# Patient Record
Sex: Female | Born: 1995 | Race: White | Hispanic: No | Marital: Married | Smoking: Never smoker
Health system: Southern US, Community
[De-identification: ages and names within clinical notes are randomized; demographics above are authoritative.]

## PROBLEM LIST (undated history)

## (undated) DIAGNOSIS — R091 Pleurisy: Secondary | ICD-10-CM

## (undated) DIAGNOSIS — Z8679 Personal history of other diseases of the circulatory system: Secondary | ICD-10-CM

## (undated) HISTORY — DX: Pleurisy: R09.1

## (undated) HISTORY — DX: Personal history of other diseases of the circulatory system: Z86.79

---
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
## 2016-10-11 DIAGNOSIS — Z8679 Personal history of other diseases of the circulatory system: Secondary | ICD-10-CM

## 2016-10-11 HISTORY — DX: Personal history of other diseases of the circulatory system: Z86.79

## 2017-01-13 ENCOUNTER — Encounter: Payer: Self-pay | Admitting: Cardiology

## 2017-01-13 ENCOUNTER — Ambulatory Visit (INDEPENDENT_AMBULATORY_CARE_PROVIDER_SITE_OTHER): Payer: BLUE CROSS/BLUE SHIELD | Admitting: Cardiology

## 2017-01-13 VITALS — BP 109/76 | HR 86 | Ht 69.0 in | Wt 136.8 lb

## 2017-01-13 DIAGNOSIS — R9431 Abnormal electrocardiogram [ECG] [EKG]: Secondary | ICD-10-CM | POA: Diagnosis not present

## 2017-01-13 DIAGNOSIS — I319 Disease of pericardium, unspecified: Secondary | ICD-10-CM

## 2017-01-13 DIAGNOSIS — B3323 Viral pericarditis: Secondary | ICD-10-CM | POA: Insufficient documentation

## 2017-01-13 NOTE — Patient Instructions (Signed)
Medication Instructions:  Take motrin/ibuprofen on a taper. The dosing schedule is as follows:  Take  THREE TIMES A DAY for TWO (2) DAYS Take  THREE TIMES A DAY for FOUR (4) DAYS Take  THREE TIMES A DAY for FOUR (4) DAYS Take  THREE TIMES A DAY for FOUR (4) DAYS  Please make sure you have some food on your stomach when you take this medication. Also drink 2 glasses of water each time you take the medication.  Labwork: none   Testing/Procedures:  Your physician has requested that you have an echocardiogram. Echocardiography is a painless test that uses sound waves to create images of your heart. It provides your doctor with information about the size and shape of your heart and how well your heart's chambers and valves are working. This procedure takes approximately one hour. There are no restrictions for this procedure.   Follow-Up: 3-4 weeks with Dr. Herbie Baltimore   If you need a refill on your cardiac medications before your next appointment, please call your pharmacy.

## 2017-01-13 NOTE — Progress Notes (Signed)
PCP: Pcp Not In System  Clinic Note: Chief Complaint  Patient presents with  . New Evaluation    pt c/o chest pain/pressure, stabbing and dizziness   . Chest Pain    Previously diagnosed with pericarditis    HPI: Rachel Carney is a 21 y.o. female with a PMH below who presents today for Evaluation of recurrent chest pain with prior diagnosis of pericarditis.  Recent Hospitalizations: [facility] March 31 20 --  presented with midsternal chest pain and shortness of breath for about a month. She had been diagnosed with pericarditis back in January 2018 but still noting continuous pressure-like sensation in chest. Pain is worse with deep inspiration. Apparently was tested for flu and strep back in January that was negative. She does have history of panic attacks. She was given Toradol made her feel much better.  Studies Reviewed:  CT angiogram chest regional on March 2018: No PE. Normal heart. No atherosclerotic disease noted. Unremarkable mediastinum. Clear lungs.  Interval History: Rachel Carney is a very pleasant young lady is presenting now for reevaluation for her sharp chest discomfort. Her initial evaluation was back in January up in [facility], [place] where she was noted to have some EKG abnormality is in the setting of sharp stabbing chest discomfort along with dyspnea. She was treated with a short course of steroids and ibuprofen.. Initially her symptoms resolved, but they have suddenly come back and are now persistent. She notes a constant pressure in her chest, it is not made worse or better by any activity. She states that there is no improvement or exacerbation by physicians. Nothing makes it worse as far as walking or lying down, sitting up. She does state it is exacerbated by taking a deep breath or coughing. She is now dealing with yet again another cold illness that she caught over spring break. Now with all this coughing the chest discomfort has significantly  worsened.  She denies any PND, orthopnea or edema.  No palpitations, lightheadedness, dizziness, weakness or syncope/near syncope. No TIA/amaurosis fugax symptoms.  No claudication.  ROS: A comprehensive was performed. Review of Systems  Constitutional: Positive for malaise/fatigue (Currently dealing with a viral illness). Negative for chills and fever.  HENT: Positive for congestion.   Respiratory: Positive for cough.   Cardiovascular: Positive for chest pain.  Neurological: Positive for dizziness (With standing up real fast).  Psychiatric/Behavioral: The patient is nervous/anxious (No recent panic attacks).   All other systems reviewed and are negative.   Past Medical History:  Diagnosis Date  . History of viral pericarditis 10/2016   Treated with short course steroids    History reviewed. No pertinent surgical history.  Current Meds  Medication Sig  . ibuprofen (ADVIL,MOTRIN) 800 MG tablet Take 800 mg by mouth every 8 (eight) hours as needed.  Marland Kitchen LORazepam (ATIVAN) 1 MG tablet Take 0.5 mg by mouth daily as needed.  . meloxicam (MOBIC) 7.5 MG tablet Take 7.5 mg by mouth daily.  . Multiple Vitamins-Minerals (MULTIVITAMIN WITH MINERALS) tablet Take 1 tablet by mouth daily.  . sertraline (ZOLOFT) 100 MG tablet Take 150 mg by mouth daily.    No Known Allergies  Social History   Social History  . Marital status: Single    Spouse name: N/A  . Number of children: N/A  . Years of education: N/A   Social History Main Topics  . Smoking status: Never Smoker  . Smokeless tobacco: Never Used  . Alcohol use None  . Drug use:  Unknown  . Sexual activity: Not Asked   Other Topics Concern  . None   Social History Narrative  . None    family history includes Anxiety disorder in her father; Atrial fibrillation in her maternal grandfather; Hypertension in her maternal grandfather; Lymphoma in her maternal grandmother.  Wt Readings from Last 3 Encounters:  01/13/17 136 lb  12.8 oz (62.1 kg) (64 %, Z= 0.37)*   * Growth percentiles are based on CDC 2-20 Years data.    PHYSICAL EXAM BP 109/76 (BP Location: Left Arm, Patient Position: Sitting, Cuff Size: Normal)   Pulse 86   Ht  (1.753 m)   Wt 136 lb 12.8 oz (62.1 kg)   BMI 20.20 kg/m  General appearance: alert, cooperative, appears stated age, no distress and Otherwise healthy-appearing. Well-nourished and well-groomed. She does seem a little bit ill, and his swelling somewhat from current illness. Neck: no adenopathy, no carotid bruit and no JVD Lungs: clear to auscultation bilaterally, normal percussion bilaterally and non-labored Heart: regular rate and rhythm, S1 and S2 normal, no murmur, click, rub or gallop; nondisplaced PMI Abdomen: soft, non-tender; bowel sounds normal; no masses,  no organomegaly; no HJR no HJRExtremities: extremities normal, atraumatic, no cyanosis,  Or edema  Pulses: 2+ and symmetric;  Skin: mobility and turgor normal, no edema, no evidence of bleeding or bruising and no lesions noted or  Neurologic: Mental status: Alert, oriented, thought content appropriate Cranial nerves: normal (II-XII grossly intact)    Adult ECG Report  Rate:  78 ;  Rhythm: normal sinus rhythm and Right axis deviation. Otherwise normal intervals and durations.;   Narrative Interpretation:  Relatively normal EKG.   Other studies Reviewed: Additional studies/ records that were reviewed today include:  Recent Labs:   NA    ASSESSMENT / PLAN: Problem List Items Addressed This Visit    Abnormal EKG    Right axis deviation noted on EKG. Need to evaluate for structural abnormality along with potential evidence of pericarditis.      Relevant Orders   EKG 12-Lead   ECHOCARDIOGRAM COMPLETE   Viral pericarditis - Primary    It sounds like her initial event was associated with a viral illness, and she currently is again having a viral illness. Her chest pain has recurred and I'm concerned for  persistent chronic pericarditis.   I do not feel that she was adequately treated initially. Plan: Check 2-D echocardiogram to exclude fibrinous pericarditis ; prolonged ibuprofen taper (see instructions below), if this does not work, would then use additional along with colchicine.       Relevant Orders   EKG 12-Lead   ECHOCARDIOGRAM COMPLETE      Current medicines are reviewed at length with the patient today. (+/- concerns) n/a The following changes have been made: n/a  Patient Instructions  Medication Instructions:  Take motrin/ibuprofen on a taper. The dosing schedule is as follows:  Take  THREE TIMES A DAY for TWO (2) DAYS Take  THREE TIMES A DAY for FOUR (4) DAYS Take  THREE TIMES A DAY for FOUR (4) DAYS Take  THREE TIMES A DAY for FOUR (4) DAYS  Please make sure you have some food on your stomach when you take this medication. Also drink 2 glasses of water each time you take the medication.  Labwork: none   Testing/Procedures:  Your physician has requested that you have an echocardiogram. Echocardiography is a painless test that uses sound waves to create images of your heart. It provides  your doctor with information about the size and shape of your heart and how well your heart's chambers and valves are working. This procedure takes approximately one hour. There are no restrictions for this procedure.   Follow-Up: 3-4 weeks with Dr. Herbie Baltimore   If you need a refill on your cardiac medications before your next appointment, please call your pharmacy.     Studies Ordered:   Orders Placed This Encounter  Procedures  . EKG 12-Lead  . ECHOCARDIOGRAM COMPLETE      Bryan Lemma, M.D., M.S. Interventional Cardiologist   Pager # [phone] Phone # [phone] [address] [place], [place] [address]

## 2017-01-17 ENCOUNTER — Encounter: Payer: Self-pay | Admitting: Cardiology

## 2017-01-17 DIAGNOSIS — R9431 Abnormal electrocardiogram [ECG] [EKG]: Secondary | ICD-10-CM | POA: Insufficient documentation

## 2017-01-17 NOTE — Assessment & Plan Note (Signed)
Right axis deviation noted on EKG. Need to evaluate for structural abnormality along with potential evidence of pericarditis.

## 2017-01-17 NOTE — Assessment & Plan Note (Addendum)
It sounds like her initial event was associated with a viral illness, and she currently is again having a viral illness. Her chest pain has recurred and I'm concerned for persistent chronic pericarditis.   I do not feel that she was adequately treated initially. Plan: Check 2-D echocardiogram to exclude fibrinous pericarditis ; prolonged ibuprofen taper (see instructions below), if this does not work, would then use additional along with colchicine.

## 2017-01-21 ENCOUNTER — Ambulatory Visit: Payer: Self-pay | Admitting: Cardiology

## 2017-01-28 ENCOUNTER — Ambulatory Visit (HOSPITAL_COMMUNITY): Payer: BLUE CROSS/BLUE SHIELD | Attending: Cardiology

## 2017-01-28 ENCOUNTER — Other Ambulatory Visit: Payer: Self-pay

## 2017-01-28 DIAGNOSIS — I301 Infective pericarditis: Secondary | ICD-10-CM | POA: Diagnosis not present

## 2017-01-28 DIAGNOSIS — Z8249 Family history of ischemic heart disease and other diseases of the circulatory system: Secondary | ICD-10-CM | POA: Insufficient documentation

## 2017-01-28 DIAGNOSIS — R9431 Abnormal electrocardiogram [ECG] [EKG]: Secondary | ICD-10-CM | POA: Diagnosis not present

## 2017-01-28 DIAGNOSIS — I319 Disease of pericardium, unspecified: Secondary | ICD-10-CM | POA: Diagnosis not present

## 2017-01-28 DIAGNOSIS — B9789 Other viral agents as the cause of diseases classified elsewhere: Secondary | ICD-10-CM | POA: Diagnosis not present

## 2017-01-30 NOTE — Progress Notes (Signed)
Echo results: Good news: Essentially normal echocardiogram and normal pump function and normal valve function.  No signs to suggest heart attack.. EF: 55-60%. No evidence of Pericardial Effusion - while this does not completely rule out Pericarditis, it does make it much less likely. I suspect that the chest pain may be more musculoskeletal related to costochondritis (inflammation of the rib cage). We still treat this with antiinflammatory medications.  DH

## 2017-02-01 ENCOUNTER — Telehealth: Payer: Self-pay | Admitting: *Deleted

## 2017-02-01 NOTE — Telephone Encounter (Signed)
LEFT MESSAGE TO CALL BACK IN REGARDS TO ECHO RESULTS 

## 2017-02-01 NOTE — Telephone Encounter (Signed)
Spoke to patient. Result given . Verbalized understanding  

## 2017-02-01 NOTE — Telephone Encounter (Signed)
-----   Message from Marykay Lex, MD sent at 01/30/2017  6:34 PM EDT ----- Echo results: Good news: Essentially normal echocardiogram and normal pump function and normal valve function.  No signs to suggest heart attack.. EF: 55-60%. No evidence of Pericardial Effusion - while this does not completely rule out Pericarditis, it does make it much less likely. I suspect that the chest pain may be more musculoskeletal related to costochondritis (inflammation of the rib cage). We still treat this with antiinflammatory medications.  DH

## 2017-02-18 ENCOUNTER — Ambulatory Visit (INDEPENDENT_AMBULATORY_CARE_PROVIDER_SITE_OTHER): Payer: BLUE CROSS/BLUE SHIELD | Admitting: Cardiology

## 2017-02-18 ENCOUNTER — Encounter: Payer: Self-pay | Admitting: Cardiology

## 2017-02-18 DIAGNOSIS — B3323 Viral pericarditis: Secondary | ICD-10-CM

## 2017-02-18 NOTE — Patient Instructions (Signed)
Your physician recommends that you schedule a follow-up appointment in: AS NEEDED  

## 2017-02-18 NOTE — Progress Notes (Signed)
PCP: Hadley Pen, MD  Clinic Note: Chief Complaint  Patient presents with  . Chest Pain    pt states some tightness and pressure on last Thursday on May 3     HPI: Tyah Acord is a 21 y.o. female with a PMH below who presents today for 1 month follow-up with an echocardiogram ordered to check for evidence of pericardial effusion. She was given a diagnosis of acute pericarditis in March.  Zaakirah Kistner was last seen on 01/13/2017 - for cardiology evaluation after recent diagnosis of viral pericarditis back in March. She was still dealing with her viral illness, and was still having some chest discomfort and dyspnea. We'll order 2-D echocardiogram to exclude peripheral effusion. Her exam is relatively benign. I recommended ibuprofen taper.  Recent Hospitalizations: None  Studies Personally Reviewed - (if available, images/films reviewed: From Epic Chart or Care Everywhere)  Essentially normal echocardiogram and normal pump function and normal valve function. No signs to suggest heart attack.. EF: 55-60%.No evidence of Pericardial Effusion  Interval History: Kassie presents today overall feeling pretty much back to baseline. She feels much better and she has had a couple occasions where she felt the chest discomfort coming on and has taken ibuprofen at high dose and is gone away. Otherwise no resting or exertional dyspnea. No PND, orthopnea or edema. No further chest pain except for those 2 episodes.   No palpitations, lightheadedness, dizziness, weakness or syncope/near syncope. No TIA/amaurosis fugax symptoms.  ROS: A comprehensive was performed. Review of Systems  Constitutional: Negative for chills, fever and malaise/fatigue.       Seems to have recovered from her viral illness  Respiratory: Negative for cough.   All other systems reviewed and are negative.   I have reviewed and (if needed) personally updated the patient's problem list, medications, allergies,  past medical and surgical history, social and family history.   Past Medical History:  Diagnosis Date  . History of viral pericarditis 10/2016   Treated with short course steroids    History reviewed. No pertinent surgical history.  Current Meds  Medication Sig  . ibuprofen (ADVIL,MOTRIN) 800 MG tablet Take 800 mg by mouth every 8 (eight) hours as needed.  Marland Kitchen LORazepam (ATIVAN) 1 MG tablet Take 0.5 mg by mouth daily as needed.  . meloxicam (MOBIC) 7.5 MG tablet Take 7.5 mg by mouth daily.  . Multiple Vitamins-Minerals (MULTIVITAMIN WITH MINERALS) tablet Take 1 tablet by mouth daily.  . sertraline (ZOLOFT) 100 MG tablet Take 150 mg by mouth daily.    No Known Allergies  Social History   Social History  . Marital status: Single    Spouse name: N/A  . Number of children: N/A  . Years of education: N/A   Social History Main Topics  . Smoking status: Never Smoker  . Smokeless tobacco: Never Used  . Alcohol use None  . Drug use: Unknown  . Sexual activity: Not Asked   Other Topics Concern  . None   Social History Narrative   She is a single Engineer, agricultural. Currently now in college. She lives at home with her mother father Christin Fudge and sister.   She never smoked and does not use at all.   She usually works out runs and plays sports routinely 7 days a week 34 hours a day.    family history includes Anxiety disorder in her father; Atrial fibrillation in her maternal grandfather; Dementia in her paternal grandmother; Heart attack (age of onset: 11) in  her paternal grandfather; Hypertension in her maternal grandfather; Lymphoma in her maternal grandmother.  Wt Readings from Last 3 Encounters:  02/18/17 134 lb (60.8 kg)  01/13/17 136 lb 12.8 oz (62.1 kg) (64 %, Z= 0.37)*   * Growth percentiles are based on CDC 2-20 Years data.    PHYSICAL EXAM BP 118/72   Pulse 63   Ht 5\' 9"  (1.753 m)   Wt 134 lb (60.8 kg)   SpO2 99%   BMI 19.79 kg/m  General appearance: alert,  cooperative, appears stated age, no distress.Healthy-appearing. Relatively thin but well-nourished well-groomed. HEENT: [place]/AT, EOMI, MMM, anicteric sclera Lungs: clear to auscultation bilaterally, normal percussion bilaterally and non-labored Heart: regular rate and rhythm, S1 &S2 normal, no murmur, click, rub or gallop; nondisplaced PMI Abdomen: soft, non-tender; bowel sounds normal; no masses,  no organomegaly;  Neurologic: Mental status: Alert & oriented x 3, thought content appropriate; non-focal exam.  Pleasant mood & affect.   Adult ECG Report None  Other studies Reviewed: Additional studies/ records that were reviewed today include:  Recent Labs:  None    ASSESSMENT / PLAN: Problem List Items Addressed This Visit    Viral pericarditis    Thankfully, her symptoms have improved. Echocardiogram did not show any signs of LV dysfunction or significant pericardial Effusion.  She does have a risk of recurrence and so I told her that if her symptoms start to come back she should start a short course of the same taper lasting maybe 3-6 days depending on her symptoms. Obviously ensuring that she hydrates and has food in her stomach.         Current medicines are reviewed at length with the patient today. (+/- concerns) None The following changes have been made: None  Patient Instructions  Your physician recommends that you schedule a follow-up appointment in: AS NEEDED    Studies Ordered:   No orders of the defined types were placed in this encounter.     Bryan Lemmaavid Abdoulie Tierce, M.D., M.S. Interventional Cardiologist   Pager # [phone] Phone # [phone] [address] [place], [place] [address]

## 2017-02-20 ENCOUNTER — Encounter: Payer: Self-pay | Admitting: Cardiology

## 2017-02-20 NOTE — Assessment & Plan Note (Signed)
Thankfully, her symptoms have improved. Echocardiogram did not show any signs of LV dysfunction or significant pericardial Effusion.  She does have a risk of recurrence and so I told her that if her symptoms start to come back she should start a short course of the same taper lasting maybe 3-6 days depending on her symptoms. Obviously ensuring that she hydrates and has food in her stomach.

## 2018-05-03 ENCOUNTER — Emergency Department (HOSPITAL_COMMUNITY): Payer: BLUE CROSS/BLUE SHIELD

## 2018-05-03 ENCOUNTER — Emergency Department (HOSPITAL_COMMUNITY)
Admission: EM | Admit: 2018-05-03 | Discharge: 2018-05-03 | Disposition: A | Discharge status: Home / Self Care | Payer: BLUE CROSS/BLUE SHIELD | Attending: Emergency Medicine | Admitting: Emergency Medicine

## 2018-05-03 ENCOUNTER — Encounter (HOSPITAL_COMMUNITY): Payer: Self-pay | Admitting: *Deleted

## 2018-05-03 DIAGNOSIS — Z79899 Other long term (current) drug therapy: Secondary | ICD-10-CM | POA: Insufficient documentation

## 2018-05-03 DIAGNOSIS — R079 Chest pain, unspecified: Secondary | ICD-10-CM | POA: Diagnosis present

## 2018-05-03 LAB — CBC
HCT: 39.9 % (ref 36.0–46.0)
Hemoglobin: 13.5 g/dL (ref 12.0–15.0)
MCH: 30.3 pg (ref 26.0–34.0)
MCHC: 33.8 g/dL (ref 30.0–36.0)
MCV: 89.5 fL (ref 78.0–100.0)
Platelets: 299 10*3/uL (ref 150–400)
RBC: 4.46 MIL/uL (ref 3.87–5.11)
RDW: 12 % (ref 11.5–15.5)
WBC: 7.3 10*3/uL (ref 4.0–10.5)

## 2018-05-03 LAB — BASIC METABOLIC PANEL
Anion gap: 8 (ref 5–15)
BUN: 12 mg/dL (ref 6–20)
CHLORIDE: 108 mmol/L (ref 98–111)
CO2: 27 mmol/L (ref 22–32)
Calcium: 10 mg/dL (ref 8.9–10.3)
Creatinine, Ser: 0.91 mg/dL (ref 0.44–1.00)
GFR calc Af Amer: >60 mL/min (ref >60)
GLUCOSE: 101 mg/dL — AB (ref 70–99)
POTASSIUM: 4.3 mmol/L (ref 3.5–5.1)
Sodium: 143 mmol/L (ref 135–145)

## 2018-05-03 LAB — I-STAT BETA HCG BLOOD, ED (MC, WL, AP ONLY): I-stat hCG, quantitative: <5 m[IU]/mL

## 2018-05-03 LAB — I-STAT TROPONIN, ED: Troponin i, poc: 0 ng/mL (ref 0.00–0.08)

## 2018-05-03 MED ORDER — LORAZEPAM 1 MG PO TABS: 0.5000 mg | ORAL_TABLET | Freq: Three times a day (TID) | ORAL | 0 refills | Status: AC | PRN

## 2018-05-03 NOTE — Discharge Instructions (Signed)
EKG, labs, and chest x-ray looked great today. Please continue taking your ativan and zoloft as directed. Follow-up with your primary care doctor. Return here for any new/acute changes.

## 2018-05-03 NOTE — ED Provider Notes (Addendum)
[facility]-EMERGENCY DEPT Provider Note   CSN: [id number] Arrival date & time: 05/03/18  1841     History   Chief Complaint Chief Complaint  Patient presents with  . Chest Pain    HPI Rachel Carney is a 22 y.o. female.  The history is provided by the patient, a parent and medical records.  Chest Pain   Associated symptoms include shortness of breath.     22 y.o. F hx of viral pericarditis, presenting to the ED with chest pain.  Patient reports 2 weeks ago she was at the beach playing basketball when she got sudden onset of chest pain and SOB.  This was short lived and fully resolved.  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  States she has been under a lot of stress because of this as they have had bad attitudes, have been cursing at her, screaming, and will not listen.  States today she was talking to them about their group presentations that are due later in the week when she developed recurrent chest pain, shortness of breath, felt lightheaded.  She went back to her room, laid down and took a long nap.  States currently she feels okay, just tired.  She has not had any fever, cough, or upper respiratory symptoms.  States she has been eating and drinking well.  Has not been out in the heat very much.  She had viral pericarditis last year, was treated with short course steroids but was ultimately released from cardiology after 1 follow-up visit.  She does not have any other known cardiac disease.  No significant family cardiac history.  She is not a smoker.  No illicit drug use.  Patient does have long-standing history of anxiety, takes PRN ativan as well as daily zoloft.  Past Medical History:  Diagnosis Date  . History of viral pericarditis 10/2016   Treated with short course steroids    Patient Active Problem List   Diagnosis Date Noted  . Abnormal EKG 01/17/2017  . Viral pericarditis 01/13/2017     History reviewed. No pertinent surgical history.   OB History   None      Home Medications    Prior to Admission medications   Medication Sig Start Date End Date Taking? Authorizing Provider  ibuprofen (ADVIL,MOTRIN) 800 MG tablet Take 400 mg by mouth every 8 (eight) hours as needed for moderate pain.  01/09/17  Yes [provider]  LORazepam (ATIVAN) 1 MG tablet Take 0.5 mg by mouth daily as needed for anxiety.    Yes [provider]  sertraline (ZOLOFT) 100 MG tablet Take 200 mg by mouth daily.  12/18/16  Yes [provider]    Family History Family History  Problem Relation Age of Onset  . Anxiety disorder Father   . Lymphoma Maternal Grandmother   . Hypertension Maternal Grandfather   . Atrial fibrillation Maternal Grandfather   . Dementia Paternal Grandmother   . Heart attack Paternal Grandfather 40    Social History Social History   Tobacco Use  . Smoking status: Never Smoker  . Smokeless tobacco: Never Used  Substance Use Topics  . Alcohol use: Not on file  . Drug use: Not on file     Allergies   Patient has no known allergies.   Review of Systems Review of Systems  Respiratory: Positive for shortness of breath.   Cardiovascular: Positive for chest pain.  Neurological: Positive  for light-headedness.  All other systems reviewed and are negative.    Physical Exam Updated Vital Signs BP (!) 125/95 (BP Location: Right Arm)   Pulse 75   Temp 98.5 F (36.9 C) (Oral)   Resp 18   LMP 04/20/2018   SpO2 100%   Physical Exam  Constitutional: She is oriented to person, place, and time. She appears well-developed and well-nourished.  HENT:  Head: Normocephalic and atraumatic.  Mouth/Throat: Oropharynx is clear and moist.  Eyes: Pupils are equal, round, and reactive to light. Conjunctivae and EOM are normal.  Neck: Normal range of motion.  Cardiovascular: Normal rate, regular rhythm and normal heart sounds.   Pulmonary/Chest: Effort normal and breath sounds normal. She has no wheezes. She has no rhonchi. She has no rales.  No chest wall tenderness, lungs clear  Abdominal: Soft. Bowel sounds are normal.  Musculoskeletal: Normal range of motion.  Neurological: She is alert and oriented to person, place, and time.  Skin: Skin is warm and dry.  Psychiatric: She has a normal mood and affect.  Nursing note and vitals reviewed.    ED Treatments / Results  Labs (all labs ordered are listed, but only abnormal results are displayed) Labs Reviewed  BASIC METABOLIC PANEL - Abnormal; Notable for the following components:      Result Value   Glucose, Bld 101 (*)    All other components within normal limits  CBC  I-STAT TROPONIN, ED  I-STAT BETA HCG BLOOD, ED ([facility], [facility], [facility] ONLY)    EKG EKG Interpretation  Date/Time:  Wednesday May 03 2018 18:53:42 EDT Ventricular Rate:  82 PR Interval:    QRS Duration: 82 QT Interval:  354 QTC Calculation: 414 R Axis:   85 Text Interpretation:  Sinus rhythm No prior ECG for comparison.  No STEMI Confirmed by Theda Belfastegeler, Chris ([id number]) on 05/03/2018 10:57:17 PM   Radiology Dg Chest 2 View  Result Date: 05/03/2018 CLINICAL DATA:  Chest pain EXAM: CHEST - 2 VIEW COMPARISON:  None. FINDINGS: The heart size and mediastinal contours are within normal limits. Both lungs are clear. The visualized skeletal structures are unremarkable. IMPRESSION: No active cardiopulmonary disease. Electronically Signed   By: Jasmine PangKim  Fujinaga M.D.   On: 05/03/2018 19:24    Procedures Procedures (including critical care time)  Medications Ordered in ED Medications - No data to display   Initial Impression / Assessment and Plan / ED Course  I have reviewed the triage vital signs and the nursing notes.  Pertinent labs & imaging results that were available during my care of the patient were reviewed by me and considered in my medical decision making (see chart for  details).  22 year old female here with episode of chest pain, shortness of breath, lightheadedness, seems to have resolved at this time.  Hx of viral pericarditis. EKG here normal sinus rhythm, no ischemia.  Labs overall reassuring.  CXR clear, no cardiomegaly.  No diffuse ST/T-wave on EKG, fever, chest wall tenderness, pain with inspiration or reclining that would suggest a recurrent pericarditis. No tachycardia or hypoxia to suggest acute PE. 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  Some of her symptoms may be related to stress-anxiety.  She does take as needed Ativan as well as daily Zoloft.  We will have her continue this.  Close follow-up with PCP encouraged.  Discussed plan with patient, she acknowledged understanding  and agreed with plan of care.  Return precautions given for new or worsening symptoms.   Final Clinical Impressions(s) / ED Diagnoses   Final diagnoses:  Chest pain in adult    ED Discharge Orders        Ordered    LORazepam (ATIVAN) 1 MG tablet  3 times daily PRN     05/03/18 2329       Garlon Hatchet, PA-C 05/04/18 0041    Garlon Hatchet, PA-C 05/04/18 0041    Tegeler, Canary Brim, MD 05/04/18 [phone]

## 2018-05-03 NOTE — ED Triage Notes (Signed)
Pt complains of chest pain and shortness of breath x 2 days, worse today. Pain was worse with walking. Pt states she had chest pain 2 weeks ago and went away. 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 Pt has hx of pericarditis.

## 2019-06-14 IMAGING — CR DG CHEST 2V
2 series · 2 of 2 positions shown · non-contrast
Comparison: None.

CLINICAL DATA: Chest pain

EXAM:
CHEST - 2 VIEW

[w chest pa]
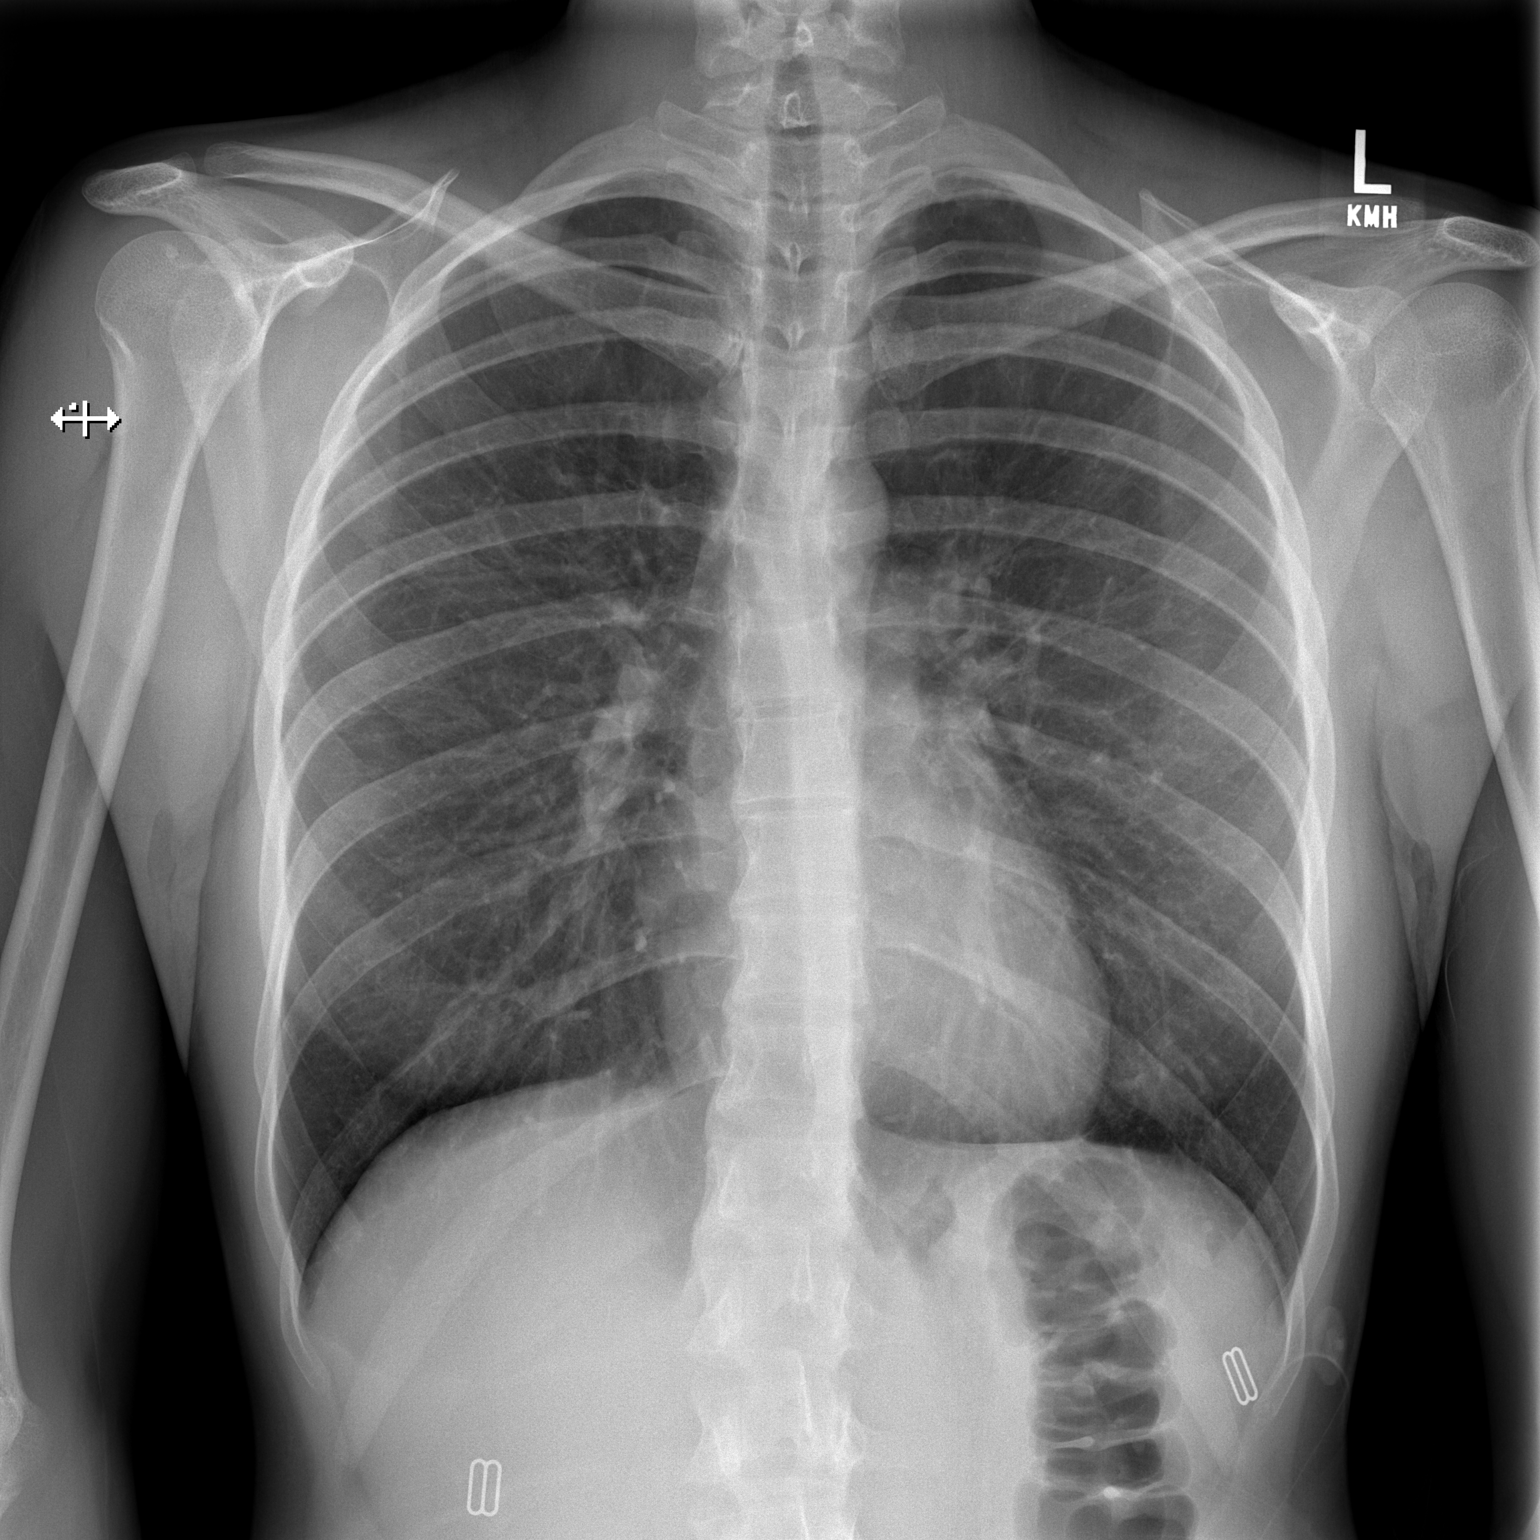

[w chest lat]
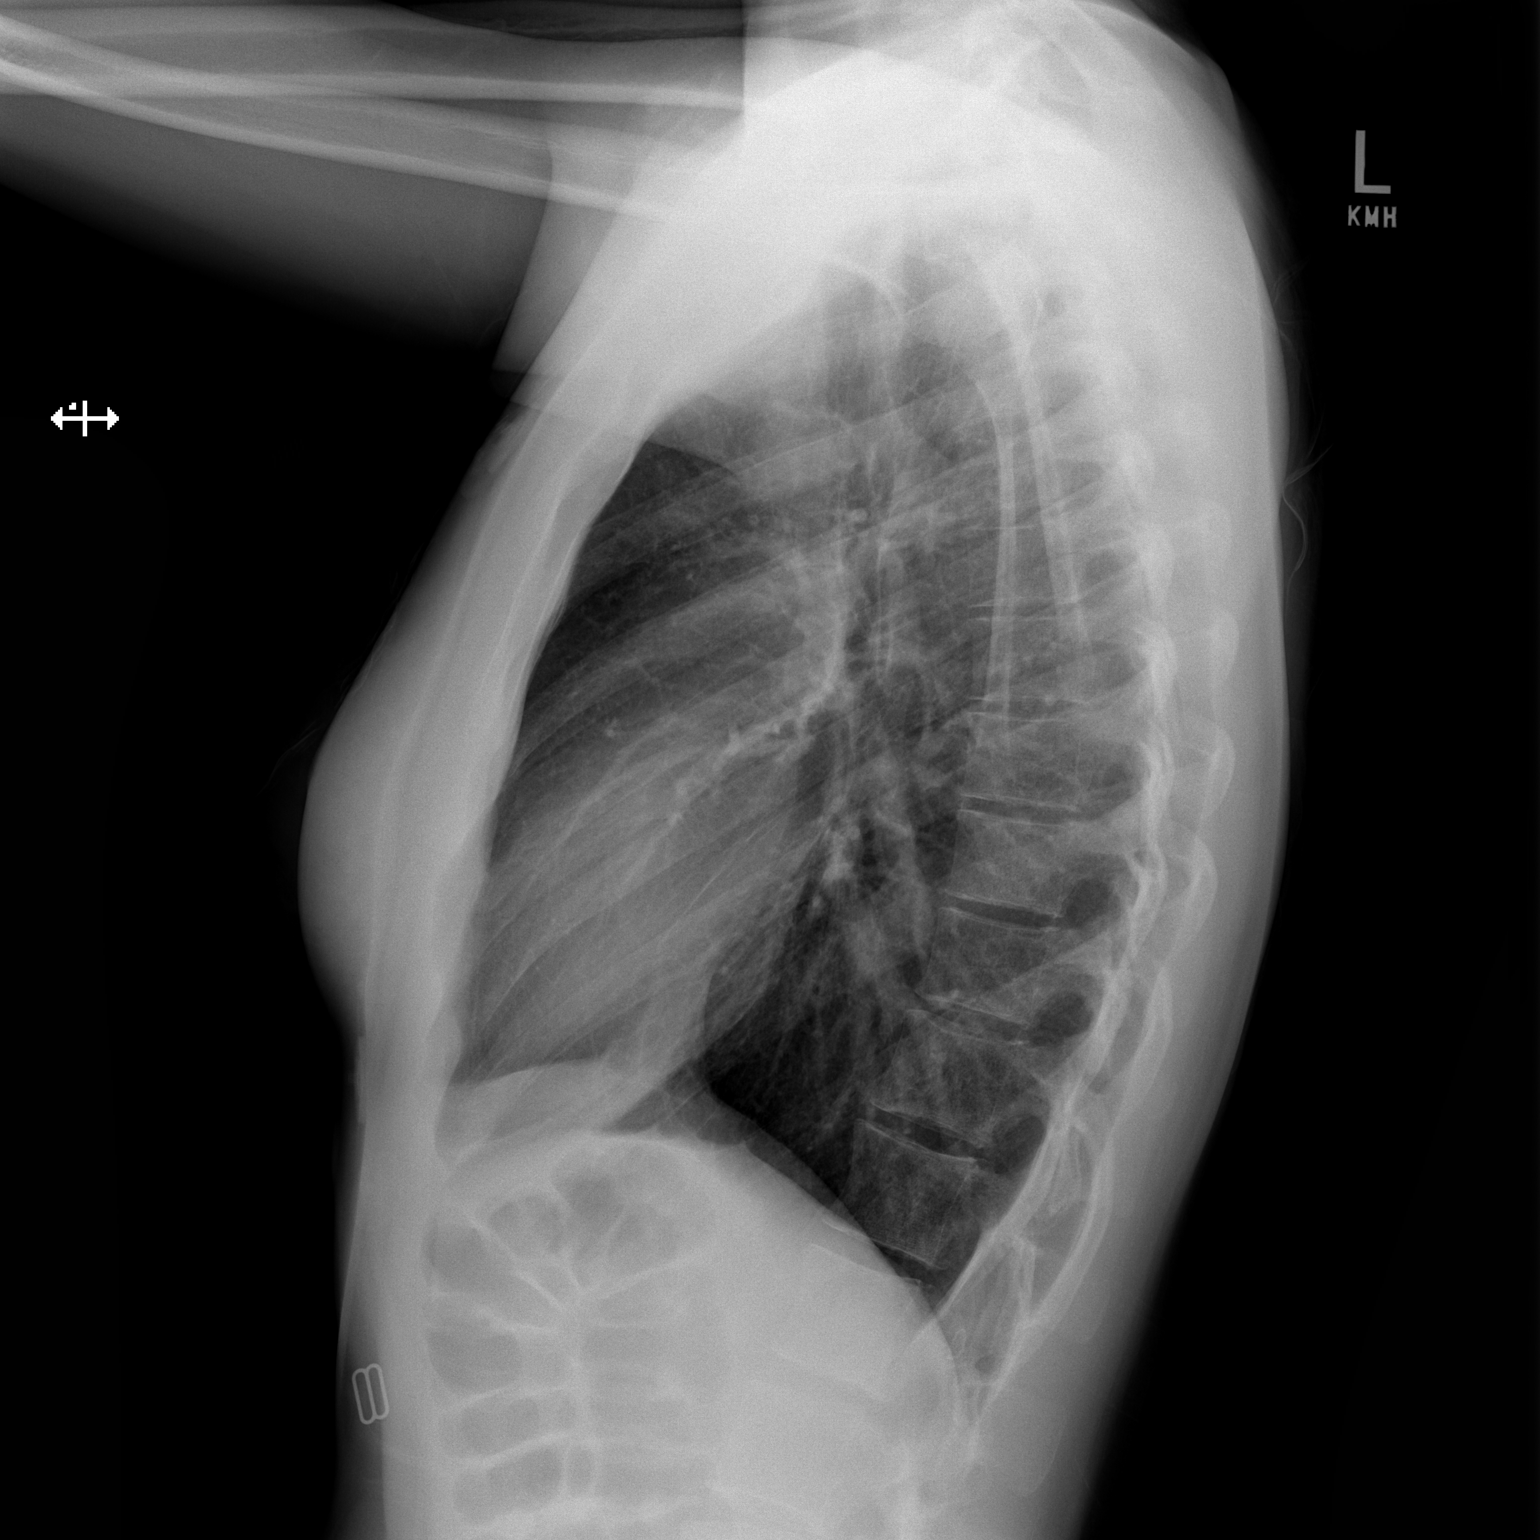

[2 of 2 positions shown; findings below may reference images not displayed]

FINDINGS: The heart size and mediastinal contours are within normal limits.
Both lungs are clear. The visualized skeletal structures are
unremarkable.
IMPRESSION: No active cardiopulmonary disease.

## 2021-12-10 LAB — HM HIV SCREENING LAB: HM HIV Screening: NEGATIVE

## 2022-10-24 DIAGNOSIS — M940 Chondrocostal junction syndrome [Tietze]: Secondary | ICD-10-CM | POA: Diagnosis not present

## 2022-10-24 DIAGNOSIS — J209 Acute bronchitis, unspecified: Secondary | ICD-10-CM | POA: Diagnosis not present

## 2023-07-28 DIAGNOSIS — R0789 Other chest pain: Secondary | ICD-10-CM | POA: Diagnosis not present

## 2023-07-28 DIAGNOSIS — R079 Chest pain, unspecified: Secondary | ICD-10-CM | POA: Diagnosis not present

## 2023-07-28 DIAGNOSIS — R110 Nausea: Secondary | ICD-10-CM | POA: Diagnosis not present

## 2023-07-28 DIAGNOSIS — R9431 Abnormal electrocardiogram [ECG] [EKG]: Secondary | ICD-10-CM | POA: Diagnosis not present

## 2023-07-28 DIAGNOSIS — R030 Elevated blood-pressure reading, without diagnosis of hypertension: Secondary | ICD-10-CM | POA: Diagnosis not present

## 2023-07-28 DIAGNOSIS — R091 Pleurisy: Secondary | ICD-10-CM | POA: Diagnosis not present

## 2023-07-28 DIAGNOSIS — R42 Dizziness and giddiness: Secondary | ICD-10-CM | POA: Diagnosis not present

## 2023-07-28 DIAGNOSIS — G43909 Migraine, unspecified, not intractable, without status migrainosus: Secondary | ICD-10-CM | POA: Diagnosis not present

## 2023-08-10 DIAGNOSIS — Z3009 Encounter for other general counseling and advice on contraception: Secondary | ICD-10-CM | POA: Diagnosis not present

## 2023-08-11 ENCOUNTER — Encounter: Payer: Self-pay | Admitting: Family Medicine

## 2023-08-11 ENCOUNTER — Ambulatory Visit: Payer: BC Managed Care – PPO | Admitting: Family Medicine

## 2023-08-11 VITALS — BP 130/68 | HR 95 | Temp 97.6°F | Ht 69.0 in | Wt 152.0 lb

## 2023-08-11 DIAGNOSIS — M2550 Pain in unspecified joint: Secondary | ICD-10-CM | POA: Diagnosis not present

## 2023-08-11 DIAGNOSIS — Z23 Encounter for immunization: Secondary | ICD-10-CM | POA: Insufficient documentation

## 2023-08-11 DIAGNOSIS — R091 Pleurisy: Secondary | ICD-10-CM | POA: Diagnosis not present

## 2023-08-11 MED ORDER — INDOMETHACIN 25 MG PO CAPS: 25.0000 mg | ORAL_CAPSULE | Freq: Three times a day (TID) | ORAL | 0 refills | Status: DC

## 2023-08-11 NOTE — Assessment & Plan Note (Addendum)
Sub acute EKG and chest xray done on 07/28/23 were both normal.  Stop taking the diclofenac sodium 50 mg and start taking Indocin 25 mg by mouth THREE TIMES A DAY with meals.  Labs drawn today to R/O lupus or RA Await labs/testing for assessment and recommendations.

## 2023-08-11 NOTE — Assessment & Plan Note (Addendum)
Chronic Labs drawn Await labs/testing for assessment and recommendations.

## 2023-08-11 NOTE — Progress Notes (Signed)
New Patient Office Visit  Subjective    Patient ID: Rachel Carney, female    DOB: 1996-01-30  Age: 27 y.o. MRN: [id number]  CC:  Chief Complaint  Patient presents with   Establish Care    HPI Rachel Carney presents to establish care and follow up from recent visit to the hospital. Patient was diagnosed with pleuritis following a viral infection. She was prescribed prednisone taper and diclofenac sodium 50 mg by mouth THREE TIMES A DAY. They did an EKG and chest xray which were both normal. Patient states that she also has a history of pleuritis and pericarditis in the last year as well (all following a cold). Patient has no family history of heart disease. Patient still has the dizziness, shortness of breath, burning in her chest when she raises her arms, and headaches. 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 Patient does not smoke or drink. Patient does report multiple joint pain ever since she was young (elbows, shoulders, knees, and wrists).    Outpatient Encounter Medications as of 08/11/2023  Medication Sig   DULoxetine (CYMBALTA) 60 MG capsule Take 60 mg by mouth daily.   indomethacin (INDOCIN) 25 MG capsule Take 1 capsule (25 mg total) by mouth 3 (three) times daily with meals.   LORazepam (ATIVAN) 1 MG tablet Take 0.5 tablets (0.5 mg total) by mouth 3 (three) times daily as needed for anxiety.   norethindrone-ethinyl estradiol (LOESTRIN) 1-20 MG-MCG tablet Take 1 tablet by mouth daily.   ibuprofen (ADVIL,MOTRIN) 800 MG tablet Take 400 mg by mouth every 8 (eight) hours as needed for moderate pain.    [DISCONTINUED] sertraline (ZOLOFT) 100 MG tablet Take 200 mg by mouth daily.    No facility-administered encounter medications on file as of 08/11/2023.    Past Medical History:  Diagnosis Date   History of viral pericarditis 10/2016   Treated with short course steroids   Pleuritis      History reviewed. No pertinent surgical history.  Family History  Problem Relation Age of Onset   Anxiety disorder Father    Lymphoma Maternal Grandmother    Hypertension Maternal Grandfather    Atrial fibrillation Maternal Grandfather    Dementia Paternal Grandmother    Heart attack Paternal Grandfather 7    Social History   Socioeconomic History   Marital status: Married    Spouse name: Thurmon Fair   Number of children: Not on file   Years of education: Not on file   Highest education level: Not on file  Occupational History   Occupation: Acupuncturist  Tobacco Use   Smoking status: Never   Smokeless tobacco: Never  Vaping Use   Vaping status: Never Used  Substance and Sexual Activity   Alcohol use: Yes   Drug use: Never   Sexual activity: Yes    Partners: Male    Birth control/protection: Pill  Other Topics Concern   Not on file  Social History Narrative   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 She lives at home with her mother father Christin Fudge and sister.   She never smoked and does not use at all.   She usually works out runs and plays sports routinely 7 days a week 34 hours a day.   Social Determinants of Health   Financial Resource Strain: Low Risk  (08/11/2023)   Overall Financial Resource Strain (CARDIA)  Difficulty of Paying Living Expenses: Not hard at all  Food Insecurity: No Food Insecurity (08/11/2023)   Hunger Vital Sign    Worried About Running Out of Food in the Last Year: Never true    Ran Out of Food in the Last Year: Never true  Transportation Needs: No Transportation Needs (08/11/2023)   PRAPARE - Administrator, Civil Service (Medical): No    Lack of Transportation (Non-Medical): No  Physical Activity: Sufficiently Active (08/11/2023)   Exercise Vital Sign    Days of Exercise per Week: 5 days    Minutes of Exercise per Session: 60 min  Stress: No Stress Concern Present (08/11/2023)    [employer] of Occupational Health - Occupational Stress Questionnaire    Feeling of Stress : Not at all  Social Connections: Moderately Integrated (08/11/2023)   Social Connection and Isolation Panel [NHANES]    Frequency of Communication with Friends and Family: More than three times a week    Frequency of Social Gatherings with Friends and Family: More than three times a week    Attends Religious Services: More than 4 times per year    Active Member of [employer] or Organizations: No    Attends Banker Meetings: Never    Marital Status: Married  Catering manager Violence: Not At Risk (08/11/2023)   Humiliation, Afraid, Rape, and Kick questionnaire    Fear of Current or Ex-Partner: No    Emotionally Abused: No    Physically Abused: No    Sexually Abused: No    Review of Systems  Constitutional:  Negative for chills, fever and malaise/fatigue.  HENT:  Negative for ear pain, sinus pain and sore throat.   Respiratory:  Positive for shortness of breath. Negative for cough.   Cardiovascular:  Negative for chest pain.  Gastrointestinal: Negative.   Genitourinary: Negative.   Musculoskeletal:  Positive for joint pain (multiple joints). Negative for myalgias.  Neurological:  Positive for dizziness and headaches.  Psychiatric/Behavioral:  Negative for depression and suicidal ideas. The patient is nervous/anxious.       Objective    BP 130/68   Pulse 95   Temp 97.6 F (36.4 C)   Ht 5\' 9"  (1.753 m)   Wt 152 lb (68.9 kg)   LMP 08/10/2023   SpO2 96%   BMI 22.45 kg/m   Physical Exam Constitutional:      General: She is not in acute distress.    Appearance: Normal appearance. She is not ill-appearing.  HENT:     Head: Normocephalic.     Nose: Nose normal.  Eyes:     Conjunctiva/sclera: Conjunctivae normal.  Cardiovascular:     Rate and Rhythm: Normal rate and regular rhythm.     Heart sounds: Normal heart sounds.  Pulmonary:     Effort: Pulmonary effort is  normal.     Breath sounds: Normal breath sounds.  Abdominal:     General: Bowel sounds are normal.     Palpations: Abdomen is soft.  Skin:    General: Skin is warm.  Neurological:     Mental Status: She is alert. Mental status is at baseline.  Psychiatric:        Mood and Affect: Mood normal.         Assessment & Plan:   Problem List Items Addressed This Visit       Respiratory   Pleuritis - Primary    Sub acute EKG and chest xray done on 07/28/23 were both  normal.  Stop taking the diclofenac sodium 50 mg and start taking Indocin 25 mg by mouth THREE TIMES A DAY with meals.  Labs drawn today to R/O lupus or RA Await labs/testing for assessment and recommendations.       Relevant Medications   indomethacin (INDOCIN) 25 MG capsule     Other   Multiple joint pain    Chronic Labs drawn Await labs/testing for assessment and recommendations.        Relevant Orders   Rheumatoid factor   CYCLIC CITRUL PEPTIDE ANTIBODY, IGG/IGA   Sedimentation rate   C-reactive protein   ANA w/Reflex   Parvovirus B19 antibody, IgG and IgM   Encounter for immunization   Relevant Orders   Flu vaccine trivalent PF, 6mos and older(Flulaval,Afluria,Fluarix,Fluzone) (Completed)   Pfizer Comirnaty Covid-19 Vaccine 6yrs & older (Completed)    Follow-up: Return if symptoms worsen or fail to improve.   Total time spent on today's visit was greater than 30 minutes, including both face-to-face time and nonface-to-face time personally spent on review of chart (labs and imaging), discussing labs and goals, discussing further work-up, treatment options, referrals to specialist if needed, reviewing outside records I f pertinent, answering patient's questions, and coordinating care.   Lajuana Matte, FNP [facility] [phone]

## 2023-08-12 LAB — RHEUMATOID FACTOR: Rheumatoid fact SerPl-aCnc: <10 [IU]/mL (ref <14.0)

## 2023-08-12 LAB — CYCLIC CITRUL PEPTIDE ANTIBODY, IGG/IGA: Cyclic Citrullin Peptide Ab: 7 U (ref 0–19)

## 2023-08-12 LAB — PARVOVIRUS B19 ANTIBODY, IGG AND IGM
Parvovirus B19 IgG: 4.7 {index} — ABNORMAL HIGH (ref 0.0–0.8)
Parvovirus B19 IgM: 0.2 {index} (ref 0.0–0.8)

## 2023-08-12 LAB — SEDIMENTATION RATE: Sed Rate: 2 mm/h (ref 0–32)

## 2023-08-12 LAB — ANA W/REFLEX: Anti Nuclear Antibody (ANA): NEGATIVE

## 2023-08-12 LAB — C-REACTIVE PROTEIN: CRP: <1 mg/L (ref 0–10)

## 2023-08-16 ENCOUNTER — Other Ambulatory Visit: Payer: Self-pay | Admitting: Family Medicine

## 2023-08-16 DIAGNOSIS — R091 Pleurisy: Secondary | ICD-10-CM

## 2023-09-07 ENCOUNTER — Other Ambulatory Visit: Payer: Self-pay | Admitting: Family Medicine

## 2023-09-07 DIAGNOSIS — R091 Pleurisy: Secondary | ICD-10-CM

## 2023-10-09 ENCOUNTER — Other Ambulatory Visit: Payer: Self-pay | Admitting: Family Medicine

## 2023-10-09 DIAGNOSIS — R091 Pleurisy: Secondary | ICD-10-CM

## 2023-11-10 NOTE — Progress Notes (Signed)
Acute Office Visit  Subjective:    Patient ID: Rachel Carney, female    DOB: [birth year], 28 y.o.   MRN: [id number]  Chief Complaint  Patient presents with   Shoulder and back pain    Discussed the use of AI scribe software for clinical note transcription with the patient, who gave verbal consent to proceed.  HPI: Patient is in today for left shoulder and neck to mid back pain. Patient states she played softball for years, and the past months has got to were she can't sleep. The pain keeps her up. Patient states she has constant, she states abduction, flexion is hard but external rotation is the worse. Patient states she has different ways to sleep, different pillows. Patient states shoulder pops.   Chronic shoulder, neck, and back pain has been worsening over time. The pain is described as burning and shooting, with tingling sensations down the arm. It is exacerbated by certain movements and positions, such as lying on the back or the opposite side, and is slightly relieved by lying on the affected side. 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  There is a history of shoulder issues dating back to seventh grade, when they attended physical therapy for shoulder problems. The current condition is attributed to years of overuse rather than a specific injury.  Various treatments have been tried, including NSAIDs like Motrin and Tylenol, which have not alleviated the pain. Heat therapy, such as sitting on a heated seat in the car between clients, has been used extensively but without significant relief. No steroid treatments have been administered for this issue, only for infections in the past. There are no known allergies to medications.  The pain affects sleep, as certain positions exacerbate the discomfort. Multiple pillows are needed to support the arm and back while reading, indicating the impact on daily  activities.  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 The need to move quickly in their job makes managing the pain difficult.  Past Medical History:  Diagnosis Date   History of viral pericarditis 10/2016   Treated with short course steroids   Pleuritis     History reviewed. No pertinent surgical history.  Family History  Problem Relation Age of Onset   Anxiety disorder Father    Lymphoma Maternal Grandmother    Hypertension Maternal Grandfather    Atrial fibrillation Maternal Grandfather    Dementia Paternal Grandmother    Heart attack Paternal Grandfather 75    Social History   Socioeconomic History   Marital status: Married    Spouse name: Thurmon Fair   Number of children: Not on file   Years of education: Not on file   Highest education level: Not on file  Occupational History   Occupation: Acupuncturist  Tobacco Use   Smoking status: Never   Smokeless tobacco: Never  Vaping Use   Vaping status: Never Used  Substance and Sexual Activity   Alcohol use: Yes   Drug use: Never   Sexual activity: Yes    Partners: Male    Birth control/protection: Pill  Other Topics Concern   Not on file  Social History Narrative   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 She lives at home with her mother father Christin Fudge and sister.   She never smoked and does not use at all.   She usually works out runs and  plays sports routinely 7 days a week 34 hours a day.   Social Drivers of Corporate investment banker Strain: Low Risk  (08/11/2023)   Overall Financial Resource Strain (CARDIA)    Difficulty of Paying Living Expenses: Not hard at all  Food Insecurity: No Food Insecurity (08/11/2023)   Hunger Vital Sign    Worried About Running Out of Food in the Last Year: Never true    Ran Out of Food in the Last Year: Never true  Transportation Needs: No Transportation Needs (08/11/2023)   PRAPARE - Therapist, art (Medical): No    Lack of Transportation (Non-Medical): No  Physical Activity: Sufficiently Active (08/11/2023)   Exercise Vital Sign    Days of Exercise per Week: 5 days    Minutes of Exercise per Session: 60 min  Stress: No Stress Concern Present (08/11/2023)   [employer] of Occupational Health - Occupational Stress Questionnaire    Feeling of Stress : Not at all  Social Connections: Moderately Integrated (08/11/2023)   Social Connection and Isolation Panel [NHANES]    Frequency of Communication with Friends and Family: More than three times a week    Frequency of Social Gatherings with Friends and Family: More than three times a week    Attends Religious Services: More than 4 times per year    Active Member of [employer] or Organizations: No    Attends Banker Meetings: Never    Marital Status: Married  Catering manager Violence: Not At Risk (08/11/2023)   Humiliation, Afraid, Rape, and Kick questionnaire    Fear of Current or Ex-Partner: No    Emotionally Abused: No    Physically Abused: No    Sexually Abused: No    Outpatient Medications Prior to Visit  Medication Sig Dispense Refill   DULoxetine (CYMBALTA) 60 MG capsule Take 60 mg by mouth daily.     indomethacin (INDOCIN) 25 MG capsule TAKE 1 CAPSULE BY MOUTH 3 TIMES DAILY WITH MEALS. 90 capsule 0   LORazepam (ATIVAN) 1 MG tablet Take 0.5 tablets (0.5 mg total) by mouth 3 (three) times daily as needed for anxiety. 12 tablet 0   norethindrone-ethinyl estradiol (LOESTRIN) 1-20 MG-MCG tablet Take 1 tablet by mouth daily.     No facility-administered medications prior to visit.    No Known Allergies  Review of Systems  Constitutional:  Negative for appetite change, fatigue and fever.  HENT:  Negative for congestion, ear pain, sinus pressure and sore throat.   Respiratory:  Negative for cough, chest tightness, shortness of breath and wheezing.   Cardiovascular:  Negative for chest  pain and palpitations.  Gastrointestinal:  Negative for abdominal pain, constipation, diarrhea, nausea and vomiting.  Genitourinary:  Negative for dysuria and hematuria.  Musculoskeletal:  Positive for arthralgias (left shoulder and neck), back pain and myalgias. Negative for joint swelling.  Skin:  Negative for rash.  Neurological:  Negative for dizziness, weakness and headaches.  Psychiatric/Behavioral:  Negative for dysphoric mood. The patient is not nervous/anxious.        Objective:        11/11/2023    7:46 AM 08/11/2023    8:33 AM 05/03/2018   10:55 PM  Vitals with BMI  Height 5\' 9"  5\' 9"    Weight 152 lbs 6 oz 152 lbs   BMI 22.5 22.44   Systolic 110 130 161  Diastolic 70 68 95  Pulse  95 75    No data found.  Physical Exam Vitals reviewed.  Constitutional:      General: She is not in acute distress.    Appearance: Normal appearance.  Eyes:     Conjunctiva/sclera: Conjunctivae normal.  Cardiovascular:     Rate and Rhythm: Normal rate and regular rhythm.     Heart sounds: Normal heart sounds. No murmur heard. Pulmonary:     Effort: Pulmonary effort is normal.     Breath sounds: Normal breath sounds. No wheezing.  Musculoskeletal:     Right shoulder: Normal.     Left shoulder: Tenderness present. Decreased range of motion.  Skin:    General: Skin is warm.  Neurological:     Mental Status: She is alert. Mental status is at baseline.  Psychiatric:        Mood and Affect: Mood normal.        Behavior: Behavior normal.   Joint Injection/Arthrocentesis  Date/Time: 11/11/2023 9:05 AM  Performed by: Renne Crigler, FNP Authorized by: Renne Crigler, FNP  Indications: pain  Body area: shoulder Joint: left shoulder Local anesthesia used: no  Anesthesia: Local anesthesia used: no  Sedation: Patient sedated: no  Needle size: 22 G Ultrasound guidance: no Approach: posterior Aspirate: clear Aspirate amount: 1 mL Triamcinolone amount: 40 mg Lidocaine 1%  amount: 5 mL Comments: Patient had a vasovagal response and felt nauseous shortly after the procedure, no syncope. After 10 minutes patient felt fine and no complications or bleeding at the site.      Health Maintenance Due  Topic Date Due   Cervical Cancer Screening (Pap smear)  06/28/2023    There are no preventive care reminders to display for this patient.   No results found for: "TSH" Lab Results  Component Value Date   WBC 7.3 05/03/2018   HGB 13.5 05/03/2018   HCT 39.9 05/03/2018   MCV 89.5 05/03/2018   PLT 299 05/03/2018   Lab Results  Component Value Date   NA 143 05/03/2018   K 4.3 05/03/2018   CO2 27 05/03/2018   GLUCOSE 101 (H) 05/03/2018   BUN 12 05/03/2018   CREATININE 0.91 05/03/2018   CALCIUM 10.0 05/03/2018   ANIONGAP 8 05/03/2018   No results found for: "CHOL" No results found for: "HDL" No results found for: "LDLCALC" No results found for: "TRIG" No results found for: "CHOLHDL" No results found for: "HGBA1C"     Assessment & Plan:  Chronic right shoulder pain Assessment & Plan: Likely due to overuse and possible nerve impingement. Pain is worsening with decreased range of motion and burning sensation. NSAIDs have not been effective. -Administer steroid injection into the shoulder joint today for immediate relief. -Prescribe Flexeril 5mg , may increase to 10mg  if 5mg  is not effective. -Refer to Dr. Antoine Primas, a sports medicine specialist, for further evaluation and management.  Orders: -     Ambulatory referral to Sports Medicine -     Cyclobenzaprine HCl; Take 1 tablet (5 mg total) by mouth 3 (three) times daily as needed for muscle spasms.  Dispense: 30 tablet; Refill: 1 -     Triamcinolone Acetonide -     Arthrocentesis     Meds ordered this encounter  Medications   cyclobenzaprine (FLEXERIL) 5 MG tablet    Sig: Take 1 tablet (5 mg total) by mouth 3 (three) times daily as needed for muscle spasms.    Dispense:  30 tablet     Refill:  1   triamcinolone acetonide (KENALOG-40) injection 40 mg  Orders Placed This Encounter  Procedures   Joint Injection/Arthrocentesis   Ambulatory referral to Sports Medicine     Follow-up: Return if symptoms worsen or fail to improve.  An After Visit Summary was printed and given to the patient.  Total time spent on today's visit was 37 minutes, including both face-to-face time and nonface-to-face time personally spent on review of chart (labs and imaging), discussing labs and goals, discussing further work-up, treatment options, referrals to specialist if needed, reviewing outside records if pertinent, answering patient's questions, and coordinating care.    Lajuana Matte, FNP [facility] [phone]

## 2023-11-11 ENCOUNTER — Encounter: Payer: Self-pay | Admitting: Family Medicine

## 2023-11-11 ENCOUNTER — Ambulatory Visit: Payer: BC Managed Care – PPO | Admitting: Family Medicine

## 2023-11-11 VITALS — BP 110/70 | Temp 99.0°F | Ht 69.0 in | Wt 152.4 lb

## 2023-11-11 DIAGNOSIS — G8929 Other chronic pain: Secondary | ICD-10-CM | POA: Diagnosis not present

## 2023-11-11 DIAGNOSIS — M25511 Pain in right shoulder: Secondary | ICD-10-CM | POA: Diagnosis not present

## 2023-11-11 MED ORDER — TRIAMCINOLONE ACETONIDE 40 MG/ML IJ SUSP
40.0000 mg | Freq: Once | INTRAMUSCULAR | Status: AC
  Administered 2023-11-11: 40 mg via INTRA_ARTICULAR

## 2023-11-11 MED ORDER — CYCLOBENZAPRINE HCL 5 MG PO TABS: 5.0000 mg | ORAL_TABLET | Freq: Three times a day (TID) | ORAL | 1 refills | Status: AC | PRN

## 2023-11-11 NOTE — Assessment & Plan Note (Signed)
Likely due to overuse and possible nerve impingement. Pain is worsening with decreased range of motion and burning sensation. NSAIDs have not been effective. -Administer steroid injection into the shoulder joint today for immediate relief. -Prescribe Flexeril 5mg , may increase to 10mg  if 5mg  is not effective. -Refer to Dr. Antoine Primas, a sports medicine specialist, for further evaluation and management.

## 2023-11-17 NOTE — Progress Notes (Signed)
 Ben Kaelin Holford D.[facility] [address] [place] [address] Phone: [phone]   Assessment and Plan:     1. Chronic left shoulder pain 2. Strain of left rotator cuff capsule, initial encounter 3. Neck pain -Chronic with exacerbation, initial sports medicine visit - Likely multifactorial causes for patient's ongoing left shoulder neck pain.  Left shoulder pain appears to be consistent with rotator cuff strain with additional AC joint tenderness.  However patient has additional neck pain with hypersensitivity to left lateral upper arm in a C5 distribution with history of central cord syndrome when playing softball in college - X-raying today in clinic.  My interpretation: No acute fracture or dislocation of shoulder.  No acute fracture or vertebral collapse of C-spine - Start meloxicam  15 mg daily x2 weeks.  If still having pain after 2 weeks, complete 3rd-week of NSAID. May use remaining NSAID as needed once daily for pain control.  Do not to use additional over-the-counter NSAIDs (ibuprofen, naproxen, Advil, Aleve) while taking prescription NSAIDs.  May use Tylenol [phone] mg 2 to 3 times a day for breakthrough pain. -Start HEP for shoulder and neck - Patient had CSI performed on 11/11/2023.  Documentation states that this was an intra-articular left shoulder injection, however patient's description sounds similar to a subacromial injection.  Since we are not confident in which space medication was injected, I do not recommend repeating injections at this time  Pertinent previous records reviewed include family medicine note 11/11/2023  Follow Up: 4 weeks for reevaluation.  Based on patient's presentation, chronic nature of symptoms, radicular symptoms, I suspect we will likely have to proceed with MRIs of left shoulder and neck.  We will start with conservative therapy to see if we can have improvement in symptoms before proceeding to advanced  imaging.   Subjective:   I, Rachel Carney am a scribe for Dr. Leonce.   Chief Complaint: shoulder, neck, and back pain  HPI:   12/08/23 Patient is a 28 yr old female with shoulder, neck, and back pain. Patient states left shoulder burns, after a day of work it is throbbing, center blocks, radiates to the neck and down the arm. Neck bilateral pain. Previous injuries in neck. If she breathes wrong or move to quickly she can pinch a nerve. Back pain 2 weeks ago threw out lower back. Been doing better with exercise and stretching. Has a ten unit, years of strengthen. Past year impacts sleep.  Duration? Years  Did you have an Injury to cause this pain? yes Taking Medication for pain? Muscle relaxer (flexeril  at night), IBU and tylenol as needed Numbness or Tingling? First three fingers  Does the pain Radiate? Down arm Altered gait or use?  ROM/ impairment of movement? Yes, ER, IR, shoulder ab sharp pain and limited motion   Relevant Historical Information: History of central cord syndrome and playing softball  Additional pertinent review of systems negative.   Current Outpatient Medications:    meloxicam  (MOBIC ) 15 MG tablet, Take 1 tablet (15 mg total) by mouth daily., Disp: 30 tablet, Rfl: 0   cyclobenzaprine  (FLEXERIL ) 5 MG tablet, Take 1 tablet (5 mg total) by mouth 3 (three) times daily as needed for muscle spasms., Disp: 30 tablet, Rfl: 1   DULoxetine  (CYMBALTA ) 60 MG capsule, Take 60 mg by mouth daily., Disp: , Rfl:    indomethacin  (INDOCIN ) 25 MG capsule, TAKE 1 CAPSULE BY MOUTH 3 TIMES DAILY WITH MEALS., Disp: 90 capsule, Rfl: 0  LORazepam  (ATIVAN ) 1 MG tablet, Take 0.5 tablets (0.5 mg total) by mouth 3 (three) times daily as needed for anxiety., Disp: 12 tablet, Rfl: 0   norethindrone-ethinyl estradiol (LOESTRIN) 1-20 MG-MCG tablet, Take 1 tablet by mouth daily., Disp: , Rfl:    Objective:     Vitals:   11/18/23 0948  BP: 110/70  Pulse: (!) 103  SpO2: 99%  Weight: 152  lb 9.6 oz (69.2 kg)  Height: 5' 9 (1.753 m)      Body mass index is 22.54 kg/m.    Physical Exam:    Neck Exam: Cervical Spine- Posture normal Skin- normal, intact  Neuro:  Strength-  Right Left   Deltoid (C5) 5/5 5/5  Bicep/Brachioradialis (C5/6) 5/5  5/5  Wrist Extension (C6) 5/5 5/5  Tricep (C7) 5/5 5/5  Wrist Flexion (C7) 5/5 5/5  Grip (C8) 5/5 5/5  Finger Abduction (T1) 5/5 5/5   Sensation: Hypersensitive to light palpation over left lateral upper arm compared to right.  Otherwise, intact to light touch in upper extremities bilaterally  Spurling's:  negative bilaterally Neck ROM: Full active ROM TTP: Bilateral cervical paraspinal, worse on left NTTP: cervical spinous processes,   Gen: Appears well, nad, nontoxic and pleasant Neuro:sensation intact, strength is 5/5 with df/pf/inv/ev, muscle tone wnl Skin: no suspicious lesion or defmority Psych: A&O, appropriate mood and affect  Left shoulder:  No deformity, swelling or muscle wasting No scapular winging FF 180, abd 180 with painful arc, int 0, ext 90 TTP clavicle, AC, deltoid, NTTP over the Orient,  biceps groove, humerus, deltoid, trapezius, cervical spine Neg neer, hawkins, empty can, obriens, crossarm, subscap liftoff, Negative speeds Neg ant drawer, sulcus sign, apprehension Negative Spurling's test bilat FROM of neck    Electronically signed by:  Odis Mace D.[facility] 10:28 AM 11/18/23

## 2023-11-18 ENCOUNTER — Ambulatory Visit (INDEPENDENT_AMBULATORY_CARE_PROVIDER_SITE_OTHER): Payer: BC Managed Care – PPO

## 2023-11-18 ENCOUNTER — Ambulatory Visit: Payer: BC Managed Care – PPO | Admitting: Sports Medicine

## 2023-11-18 ENCOUNTER — Other Ambulatory Visit: Payer: Self-pay | Admitting: Sports Medicine

## 2023-11-18 VITALS — BP 110/70 | HR 103 | Ht 69.0 in | Wt 152.6 lb

## 2023-11-18 DIAGNOSIS — M542 Cervicalgia: Secondary | ICD-10-CM | POA: Diagnosis not present

## 2023-11-18 DIAGNOSIS — G8929 Other chronic pain: Secondary | ICD-10-CM

## 2023-11-18 DIAGNOSIS — S46012A Strain of muscle(s) and tendon(s) of the rotator cuff of left shoulder, initial encounter: Secondary | ICD-10-CM | POA: Diagnosis not present

## 2023-11-18 DIAGNOSIS — M25512 Pain in left shoulder: Secondary | ICD-10-CM | POA: Diagnosis not present

## 2023-11-18 DIAGNOSIS — M25511 Pain in right shoulder: Secondary | ICD-10-CM

## 2023-11-18 MED ORDER — MELOXICAM 15 MG PO TABS: 15.0000 mg | ORAL_TABLET | Freq: Every day | ORAL | 0 refills | Status: DC

## 2023-11-18 NOTE — Patient Instructions (Signed)
 Xray today. Shoulder and Neck HEP.  - Start meloxicam  15 mg daily x2 weeks.  If still having pain after 2 weeks, complete 3rd-week of NSAID. May use remaining NSAID as needed once daily for pain control.  Do not to use additional over-the-counter NSAIDs (ibuprofen, naproxen, Advil, Aleve) while taking prescription NSAIDs.  May use Tylenol [phone] mg 2 to 3 times a day for breakthrough pain.  Follow up in 4 weeks.

## 2023-11-21 ENCOUNTER — Encounter: Payer: Self-pay | Admitting: Sports Medicine

## 2023-12-22 NOTE — Progress Notes (Unsigned)
    Aleen Sells D.[facility] [address] [place] [address] Phone: [phone]   Assessment and Plan:     There are no diagnoses linked to this encounter.  ***   Pertinent previous records reviewed include ***    Follow Up: ***     Subjective:   Chief Complaint: shoulder, neck, and back pain   HPI:    12/08/23 Patient is a 28 yr old female with shoulder, neck, and back pain. Patient states left shoulder burns, after a day of work it is throbbing, center blocks, radiates to the neck and down the arm. Neck bilateral pain. Previous injuries in neck. If she breathes wrong or move to quickly she can pinch a nerve. Back pain 2 weeks ago threw out lower back. Been doing better with exercise and stretching. Has a ten unit, years of strengthen. Past year impacts sleep.   Duration? Years  Did you have an Injury to cause this pain? yes Taking Medication for pain? Muscle relaxer (flexeril at night), IBU and tylenol as needed Numbness or Tingling? First three fingers  Does the pain Radiate? Down arm Altered gait or use?  ROM/ impairment of movement? Yes, ER, IR, shoulder ab sharp pain and limited motion   12/23/2023 Patient states   Relevant Historical Information: History of central cord syndrome and playing softball  Additional pertinent review of systems negative.   Current Outpatient Medications:    cyclobenzaprine (FLEXERIL) 5 MG tablet, Take 1 tablet (5 mg total) by mouth 3 (three) times daily as needed for muscle spasms., Disp: 30 tablet, Rfl: 1   DULoxetine (CYMBALTA) 60 MG capsule, Take 60 mg by mouth daily., Disp: , Rfl:    indomethacin (INDOCIN) 25 MG capsule, TAKE 1 CAPSULE BY MOUTH 3 TIMES DAILY WITH MEALS., Disp: 90 capsule, Rfl: 0   LORazepam (ATIVAN) 1 MG tablet, Take 0.5 tablets (0.5 mg total) by mouth 3 (three) times daily as needed for anxiety., Disp: 12 tablet, Rfl: 0   meloxicam (MOBIC) 15 MG tablet, Take 1 tablet (15 mg  total) by mouth daily., Disp: 30 tablet, Rfl: 0   norethindrone-ethinyl estradiol (LOESTRIN) 1-20 MG-MCG tablet, Take 1 tablet by mouth daily., Disp: , Rfl:    Objective:     There were no vitals filed for this visit.    There is no height or weight on file to calculate BMI.    Physical Exam:    ***   Electronically signed by:  Aleen Sells D[facility] 7:35 AM 12/22/23

## 2023-12-23 ENCOUNTER — Ambulatory Visit: Payer: BC Managed Care – PPO | Admitting: Sports Medicine

## 2023-12-23 VITALS — HR 73 | Ht 69.0 in | Wt 154.0 lb

## 2023-12-23 DIAGNOSIS — S46012A Strain of muscle(s) and tendon(s) of the rotator cuff of left shoulder, initial encounter: Secondary | ICD-10-CM | POA: Diagnosis not present

## 2023-12-23 DIAGNOSIS — M25512 Pain in left shoulder: Secondary | ICD-10-CM

## 2023-12-23 DIAGNOSIS — G8929 Other chronic pain: Secondary | ICD-10-CM | POA: Diagnosis not present

## 2023-12-23 NOTE — Patient Instructions (Signed)
 MRI left shoulder  Continue HEP  Follow up 5 days after

## 2024-01-10 ENCOUNTER — Ambulatory Visit (INDEPENDENT_AMBULATORY_CARE_PROVIDER_SITE_OTHER): Payer: Self-pay

## 2024-01-10 ENCOUNTER — Ambulatory Visit (INDEPENDENT_AMBULATORY_CARE_PROVIDER_SITE_OTHER): Admitting: Sports Medicine

## 2024-01-10 ENCOUNTER — Encounter: Payer: Self-pay | Admitting: Sports Medicine

## 2024-01-10 ENCOUNTER — Ambulatory Visit

## 2024-01-10 DIAGNOSIS — M25512 Pain in left shoulder: Secondary | ICD-10-CM

## 2024-01-10 DIAGNOSIS — G8929 Other chronic pain: Secondary | ICD-10-CM

## 2024-01-10 DIAGNOSIS — M7582 Other shoulder lesions, left shoulder: Secondary | ICD-10-CM | POA: Diagnosis not present

## 2024-01-10 DIAGNOSIS — M19012 Primary osteoarthritis, left shoulder: Secondary | ICD-10-CM | POA: Diagnosis not present

## 2024-01-10 DIAGNOSIS — S43432A Superior glenoid labrum lesion of left shoulder, initial encounter: Secondary | ICD-10-CM | POA: Diagnosis not present

## 2024-01-10 MED ORDER — TRIAMCINOLONE ACETONIDE 40 MG/ML IJ SUSP: 40.0000 mg | Freq: Once | INTRAMUSCULAR | Status: AC

## 2024-01-10 NOTE — Assessment & Plan Note (Signed)
 Chronic left shoulder pain, patient referred to me for MR arthrography injection. She warned [place] ahead of time that she typically vagals with these procedures. We did the arthrogram injection successfully, she did in fact vagal, we performed the typical techniques including laying her flat, elevating her legs and she recovered quickly. Further management per primary treating provider.

## 2024-01-10 NOTE — Progress Notes (Signed)
    Procedures performed today:    Procedure: Real-time Ultrasound Guided gadolinium contrast injection of left glenohumeral joint Device: Samsung HS60  Verbal informed consent obtained.  Time-out conducted.  Noted no overlying erythema, induration, or other signs of local infection.  Skin prepped in a sterile fashion.  Local anesthesia: Topical Ethyl chloride.  With sterile technique and under real time ultrasound guidance: Noted normal-appearing joint, 22-gauge spinal needle advanced into the joint from a posterior approach, I then injected 1 cc kenalog 40, 2 cc lidocaine, 2 cc bupivacaine, syringe again switched and 0.1 cc gadolinium injected, syringe again switched and 10 cc sterile saline used to fully stem the joint. Joint visualized and capsule seen distending confirming intra-articular placement of contrast material and medication. Completed without difficulty  Advised to call if fevers/chills, erythema, induration, drainage, or persistent bleeding.  Images permanently stored in PACS Impression: Technically successful ultrasound guided gadolinium contrast injection for MR arthrography.  Please see separate MR arthrogram report.  Independent interpretation of notes and tests performed by another provider:   None.  Brief History, Exam, Impression, and Recommendations:    Left shoulder pain Chronic left shoulder pain, patient referred to me for MR arthrography injection. She warned [place] ahead of time that she typically vagals with these procedures. We did the arthrogram injection successfully, she did in fact vagal, we performed the typical techniques including laying her flat, elevating her legs and she recovered quickly. Further management per primary treating provider.    ____________________________________________ Ihor Austin. Benjamin Stain, M.D., ABFM., CAQSM., AME. Primary Care and Sports Medicine [facility]  Adjunct Professor of Family Medicine   [place] [employer] [facility] [employer]  Restaurant manager, fast food

## 2024-01-12 ENCOUNTER — Ambulatory Visit (INDEPENDENT_AMBULATORY_CARE_PROVIDER_SITE_OTHER): Admitting: Family Medicine

## 2024-01-12 ENCOUNTER — Ambulatory Visit: Payer: Self-pay

## 2024-01-12 ENCOUNTER — Encounter: Payer: Self-pay | Admitting: Family Medicine

## 2024-01-12 VITALS — BP 128/62 | HR 106 | Temp 98.5°F | Resp 16 | Ht 69.0 in | Wt 151.6 lb

## 2024-01-12 DIAGNOSIS — L509 Urticaria, unspecified: Secondary | ICD-10-CM | POA: Diagnosis not present

## 2024-01-12 DIAGNOSIS — T508X5A Adverse effect of diagnostic agents, initial encounter: Secondary | ICD-10-CM | POA: Diagnosis not present

## 2024-01-12 NOTE — Progress Notes (Signed)
 Acute Office Visit  Subjective:    Patient ID: Rachel Carney, female    DOB: [birth year], 28 y.o.   MRN: [id number]  Chief Complaint  Patient presents with   Allergic Reaction    After IV contrast    Discussed the use of AI scribe software for clinical note transcription with the patient, who gave verbal consent to proceed.   HPI: Rachel Carney is a 28 year old female who presents with an allergic reaction to contrast media following an MRI on 01/10/24 for a shoulder injury. Patient stated she had redness, itching and pain at injection site.   She experienced an allergic reaction following an MRI with contrast media for a shoulder injury. Symptoms began with localized itching and redness on her arm the day after the procedure. She also experienced nausea, dizziness, and lightheadedness, which she attributes to the contrast media. She took Benadryl and Claritin to manage the symptoms, which included itching and a rash that extended down her arm and possibly her back. No difficulty breathing or swelling in her throat, but she felt lightheaded and experienced heart palpitations, described as 'heart skipping a beat.' The MRI was conducted on Tuesday at 4 PM, and she began experiencing symptoms later that day, with significant nausea and pain persisting into the following day. By Thursday, she noted improvement in her symptoms after taking Benadryl, although she still felt 'blah' and tired. She has no prior history of allergic reactions to contrast media or other substances.  The MRI was conducted to evaluate a shoulder injury as part of the diagnostic process for ongoing shoulder pain. She is awaiting the MRI results, with a follow-up appointment scheduled for next Tuesday.  Past Medical History:  Diagnosis Date   History of viral pericarditis 10/2016   Treated with short course steroids   Pleuritis     History reviewed. No pertinent surgical history.  Family History  Problem Relation  Age of Onset   Anxiety disorder Father    Lymphoma Maternal Grandmother    Hypertension Maternal Grandfather    Atrial fibrillation Maternal Grandfather    Dementia Paternal Grandmother    Heart attack Paternal Grandfather 97    Social History   Socioeconomic History   Marital status: Married    Spouse name: Thurmon Fair   Number of children: Not on file   Years of education: Not on file   Highest education level: Not on file  Occupational History   Occupation: Acupuncturist  Tobacco Use   Smoking status: Never   Smokeless tobacco: Never  Vaping Use   Vaping status: Never Used  Substance and Sexual Activity   Alcohol use: Yes   Drug use: Never   Sexual activity: Yes    Partners: Male    Birth control/protection: Pill  Other Topics Concern   Not on file  Social History Narrative   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 She lives at home with her mother father Christin Fudge and sister.   She never smoked and does not use at all.   She usually works out runs and plays sports routinely 7 days a week 34 hours a day.   Social Drivers of Corporate investment banker Strain: Low Risk  (08/11/2023)   Overall Financial Resource Strain (CARDIA)    Difficulty of Paying Living Expenses: Not hard at all  Food Insecurity: No Food Insecurity (08/11/2023)   Hunger Vital Sign    Worried About Running Out of Food  in the Last Year: Never true    Ran Out of Food in the Last Year: Never true  Transportation Needs: No Transportation Needs (08/11/2023)   PRAPARE - Administrator, Civil Service (Medical): No    Lack of Transportation (Non-Medical): No  Physical Activity: Sufficiently Active (08/11/2023)   Exercise Vital Sign    Days of Exercise per Week: 5 days    Minutes of Exercise per Session: 60 min  Stress: No Stress Concern Present (08/11/2023)   [employer] of Occupational Health - Occupational Stress Questionnaire    Feeling of  Stress : Not at all  Social Connections: Moderately Integrated (08/11/2023)   Social Connection and Isolation Panel [NHANES]    Frequency of Communication with Friends and Family: More than three times a week    Frequency of Social Gatherings with Friends and Family: More than three times a week    Attends Religious Services: More than 4 times per year    Active Member of [employer] or Organizations: No    Attends Banker Meetings: Never    Marital Status: Married  Catering manager Violence: Not At Risk (08/11/2023)   Humiliation, Afraid, Rape, and Kick questionnaire    Fear of Current or Ex-Partner: No    Emotionally Abused: No    Physically Abused: No    Sexually Abused: No    Outpatient Medications Prior to Visit  Medication Sig Dispense Refill   cyclobenzaprine (FLEXERIL) 5 MG tablet Take 1 tablet (5 mg total) by mouth 3 (three) times daily as needed for muscle spasms. 30 tablet 1   DULoxetine (CYMBALTA) 60 MG capsule Take 60 mg by mouth daily.     LORazepam (ATIVAN) 1 MG tablet Take 0.5 tablets (0.5 mg total) by mouth 3 (three) times daily as needed for anxiety. 12 tablet 0   norethindrone-ethinyl estradiol (LOESTRIN) 1-20 MG-MCG tablet Take 1 tablet by mouth daily.     indomethacin (INDOCIN) 25 MG capsule TAKE 1 CAPSULE BY MOUTH 3 TIMES DAILY WITH MEALS. 90 capsule 0   meloxicam (MOBIC) 15 MG tablet Take 1 tablet (15 mg total) by mouth daily. 30 tablet 0   Facility-Administered Medications Prior to Visit  Medication Dose Route Frequency Provider Last Rate Last Admin   triamcinolone acetonide (KENALOG-40) injection 40 mg  40 mg Intramuscular Once Monica Becton, MD        Allergies  Allergen Reactions   Ivp Dye [Iodinated Contrast Media] Hives, Itching and Nausea Only    Review of Systems  Constitutional:  Positive for fatigue. Negative for chills, diaphoresis and fever.  HENT:  Negative for congestion, ear pain and sinus pain.   Eyes: Negative.    Respiratory:  Negative for cough, chest tightness, shortness of breath and wheezing.   Cardiovascular:  Positive for palpitations. Negative for chest pain.  Gastrointestinal:  Positive for nausea. Negative for abdominal pain, constipation and vomiting.  Endocrine: Negative.   Genitourinary:  Negative for dysuria, frequency, hematuria and urgency.  Musculoskeletal:  Negative for arthralgias.  Skin: Negative.   Neurological:  Positive for dizziness. Negative for weakness and headaches.  Hematological: Negative.   Psychiatric/Behavioral:  Negative for dysphoric mood. The patient is not nervous/anxious.        Objective:        01/12/2024    3:07 PM 12/23/2023    9:45 AM 11/18/2023    9:48 AM  Vitals with BMI  Height 5\' 9"  5\' 9"  5\' 9"   Weight 151 lbs 10  oz 154 lbs 152 lbs 10 oz  BMI 22.38 22.73 22.52  Systolic 128 -- 110  Diastolic 62 -- 70  Pulse 106 73 103    No data found.   Physical Exam Constitutional:      General: She is not in acute distress.    Appearance: Normal appearance. She is ill-appearing.  Eyes:     Conjunctiva/sclera: Conjunctivae normal.  Neck:     Vascular: No carotid bruit.  Cardiovascular:     Rate and Rhythm: Normal rate and regular rhythm.     Heart sounds: Normal heart sounds. No murmur heard. Pulmonary:     Effort: Pulmonary effort is normal.     Breath sounds: Normal breath sounds. No wheezing.  Skin:    Findings: Erythema and rash present. Rash is urticarial.  Neurological:     Mental Status: She is alert. Mental status is at baseline.  Psychiatric:        Mood and Affect: Mood normal.        Behavior: Behavior normal.     Health Maintenance Due  Topic Date Due   Cervical Cancer Screening (Pap smear)  06/28/2023    There are no preventive care reminders to display for this patient.   No results found for: "TSH" Lab Results  Component Value Date   WBC 7.3 05/03/2018   HGB 13.5 05/03/2018   HCT 39.9 05/03/2018   MCV 89.5  05/03/2018   PLT 299 05/03/2018   Lab Results  Component Value Date   NA 143 05/03/2018   K 4.3 05/03/2018   CO2 27 05/03/2018   GLUCOSE 101 (H) 05/03/2018   BUN 12 05/03/2018   CREATININE 0.91 05/03/2018   CALCIUM 10.0 05/03/2018   ANIONGAP 8 05/03/2018   No results found for: "CHOL" No results found for: "HDL" No results found for: "LDLCALC" No results found for: "TRIG" No results found for: "CHOLHDL" No results found for: "HGBA1C"     Assessment & Plan:  Allergic reaction to contrast dye, initial encounter Assessment & Plan: Allergic reaction to MRI contrast media with pruritus, rash, nausea, dizziness, and arrhythmia sensation. Symptoms improved with Benadryl. Steroids unnecessary. EpiPen not required due to infrequent exposure. - Document allergy to contrast media in medical record. - Advise continuation of antihistamines as needed. - Instruct to avoid daytime Benadryl due to sedation. - Advise monitoring of symptoms and report if worsening. - Ensure future imaging uses alternative contrast or premedication.   Hives Assessment & Plan: Acute See photo - continue OTC antihistamine treatment     Follow-up: Return if symptoms worsen or fail to improve.  An After Visit Summary was printed and given to the patient.  Total time spent on today's visit was 24 minutes, including both face-to-face time and nonface-to-face time personally spent on review of chart (labs and imaging), discussing labs and goals, discussing further work-up, treatment options, referrals to specialist if needed, reviewing outside records if pertinent, answering patient's questions, and coordinating care.    Lajuana Matte, FNP [facility] [phone]

## 2024-01-12 NOTE — Assessment & Plan Note (Signed)
 Acute See photo - continue OTC antihistamine treatment

## 2024-01-12 NOTE — Assessment & Plan Note (Signed)
 Allergic reaction to MRI contrast media with pruritus, rash, nausea, dizziness, and arrhythmia sensation. Symptoms improved with Benadryl. Steroids unnecessary. EpiPen not required due to infrequent exposure. - Document allergy to contrast media in medical record. - Advise continuation of antihistamines as needed. - Instruct to avoid daytime Benadryl due to sedation. - Advise monitoring of symptoms and report if worsening. - Ensure future imaging uses alternative contrast or premedication.

## 2024-01-12 NOTE — Telephone Encounter (Signed)
  Chief Complaint: reaction Symptoms: rash and itching Frequency: this morning Pertinent Negatives: Patient denies sob Disposition: [] ED /[] Urgent Care (no appt availability in office) / [x] Appointment(In office/virtual)/ []  [facility]/ [] Home Care/ [] Refused Recommended Disposition /[] [facility]/ []  Follow-up with PCP Additional Notes: pt states she has contrast injected for an MRI on Tuesday and noticed the arm now has a rash and is itching. States she noticed redness around the area where the bandaid was and that she also has red spots down her arm.  States that she has taken benadryl about 20 minutes ago.  Copied from CRM [id number]. Topic: Clinical - Red Word Triage >> Jan 12, 2024 10:52 AM Hector Shade B wrote: [facility] that prompted transfer to Nurse Triage: Nauseous breaking out in red splotches all over her body. Patient recently had an injection as well as an MRI and has taken benadryl but is concerned because its not clearing up and needed to see if this is normal Reason for Disposition  Hives or itching  Answer Assessment - Initial Assessment Questions 1. APPEARANCE of RASH: "Describe the rash." (e.g., spots, blisters, raised areas, skin peeling, scaly)     Red spots going down arm,  2. SIZE: "How big are the spots?" (e.g., tip of pen, eraser, coin; inches, centimeters)     Eraser - to half dollar size 3. LOCATION: "Where is the rash located?"     Left arm 4. COLOR: "What color is the rash?" (Note: It is difficult to assess rash color in people with darker-colored skin. When this situation occurs, simply ask the caller to describe what they see.)     red 5. ONSET: "When did the rash begin?"     This morning 6. FEVER: "Do you have a fever?" If Yes, ask: "What is your temperature, how was it measured, and when did it start?"     Havent taken  7. ITCHING: "Does the rash itch?" If Yes, ask: "How bad is the itch?" (Scale 1-10; or mild, moderate, severe)      mild 8. CAUSE: "What do you think is causing the rash?"     Contrast 9. NEW MEDICINES: "What new medicines are you taking?" (e.g., name of antibiotic) "When did you start taking this medication?".     Had contrast done for MRI 10. OTHER SYMPTOMS: "Do you have any other symptoms?" (e.g., sore throat, fever, joint pain)       Pain, nausea  Protocols used: Rash - Widespread On Drugs-A-AH

## 2024-01-16 NOTE — Progress Notes (Unsigned)
    Aleen Sells [facility] [address] [place] [address] Phone: [phone]   Assessment and Plan:     There are no diagnoses linked to this encounter.  ***   Pertinent previous records reviewed include ***    Follow Up: ***     Subjective:   I, Rachel Carney, am serving as a Neurosurgeon for Doctor Richardean Sale   Chief Complaint: shoulder, neck, and back pain   HPI:    12/08/23 Patient is a 28 yr old female with shoulder, neck, and back pain. Patient states left shoulder burns, after a day of work it is throbbing, center blocks, radiates to the neck and down the arm. Neck bilateral pain. Previous injuries in neck. If she breathes wrong or move to quickly she can pinch a nerve. Back pain 2 weeks ago threw out lower back. Been doing better with exercise and stretching. Has a ten unit, years of strengthen. Past year impacts sleep.   Duration? Years  Did you have an Injury to cause this pain? yes Taking Medication for pain? Muscle relaxer (flexeril at night), IBU and tylenol as needed Numbness or Tingling? First three fingers  Does the pain Radiate? Down arm Altered gait or use?  ROM/ impairment of movement? Yes, ER, IR, shoulder ab sharp pain and limited motion   12/23/2023 Patient states she had improvement but this week pain has started to return  01/17/2024 Patient states   Relevant Historical Information: History of central cord syndrome and playing softball  Additional pertinent review of systems negative.   Current Outpatient Medications:    cyclobenzaprine (FLEXERIL) 5 MG tablet, Take 1 tablet (5 mg total) by mouth 3 (three) times daily as needed for muscle spasms., Disp: 30 tablet, Rfl: 1   DULoxetine (CYMBALTA) 60 MG capsule, Take 60 mg by mouth daily., Disp: , Rfl:    LORazepam (ATIVAN) 1 MG tablet, Take 0.5 tablets (0.5 mg total) by mouth 3 (three) times daily as needed for anxiety., Disp: 12 tablet, Rfl: 0    norethindrone-ethinyl estradiol (LOESTRIN) 1-20 MG-MCG tablet, Take 1 tablet by mouth daily., Disp: , Rfl:   Current Facility-Administered Medications:    triamcinolone acetonide (KENALOG-40) injection 40 mg, 40 mg, Intramuscular, Once, Monica Becton, MD   Objective:     There were no vitals filed for this visit.    There is no height or weight on file to calculate BMI.    Physical Exam:    ***   Electronically signed by:  Aleen Sells [facility] 1:33 PM 01/16/24

## 2024-01-17 ENCOUNTER — Ambulatory Visit: Admitting: Sports Medicine

## 2024-01-17 VITALS — BP 118/76 | HR 94 | Ht 69.0 in | Wt 151.0 lb

## 2024-01-17 DIAGNOSIS — G8929 Other chronic pain: Secondary | ICD-10-CM

## 2024-01-17 DIAGNOSIS — M25512 Pain in left shoulder: Secondary | ICD-10-CM

## 2024-01-17 DIAGNOSIS — M7582 Other shoulder lesions, left shoulder: Secondary | ICD-10-CM | POA: Diagnosis not present

## 2024-01-17 NOTE — Patient Instructions (Signed)
PT referral  3-4 week follow up  

## 2024-02-07 ENCOUNTER — Ambulatory Visit: Admitting: Sports Medicine

## 2024-05-18 ENCOUNTER — Encounter: Payer: Self-pay | Admitting: Family Medicine

## 2024-05-18 ENCOUNTER — Ambulatory Visit: Admitting: Family Medicine

## 2024-05-18 VITALS — BP 138/76 | HR 77 | Temp 98.2°F | Resp 16 | Ht 69.0 in | Wt 158.8 lb

## 2024-05-18 DIAGNOSIS — G43009 Migraine without aura, not intractable, without status migrainosus: Secondary | ICD-10-CM

## 2024-05-18 DIAGNOSIS — F419 Anxiety disorder, unspecified: Secondary | ICD-10-CM

## 2024-05-18 DIAGNOSIS — F32A Depression, unspecified: Secondary | ICD-10-CM | POA: Diagnosis not present

## 2024-05-18 DIAGNOSIS — N941 Unspecified dyspareunia: Secondary | ICD-10-CM | POA: Diagnosis not present

## 2024-05-18 MED ORDER — DULOXETINE HCL 60 MG PO CPEP: 60.0000 mg | ORAL_CAPSULE | Freq: Every day | ORAL | 1 refills | Status: DC

## 2024-05-18 MED ORDER — NURTEC 75 MG PO TBDP: 75.0000 mg | ORAL_TABLET | Freq: Every day | ORAL | Status: AC | PRN

## 2024-05-18 NOTE — Patient Instructions (Addendum)
  VISIT SUMMARY: Today, you came in for a refill of Cymbalta  and to discuss the management of your stress-related migraines. You mentioned that you have had a particularly stressful week and are currently on your menstrual period, which has made you feel emotionally drained. You also described a recent severe migraine episode that was partially relieved by Tylenol.  YOUR PLAN: -MAJOR DEPRESSIVE DISORDER: Major depressive disorder is a mental health condition characterized by persistent feelings of sadness and loss of interest. You have been experiencing increased distress after stopping Cymbalta , especially during your menstrual period. We will continue your Cymbalta  60 mg and have sent a prescription to [facility] in [place] for a 90-day supply.  -MIGRAINE ASSOCIATED WITH STRESS: Migraines are severe headaches often accompanied by nausea and sensitivity to light and sound, and can be triggered by stress. You experienced a severe migraine recently, likely due to stress. We provided you with a sample of Nurtec for acute relief. Please use Nurtec as needed and report its effectiveness so we can consider a future prescription.  -GENERAL HEALTH MAINTENANCE: We discussed the importance of scheduling your annual physical and baseline labs to ensure a comprehensive health assessment. Please make sure to schedule these appointments.  INSTRUCTIONS: Please schedule your annual physical and baseline labs. Continue taking Cymbalta  60 mg as prescribed and use Nurtec as needed for migraines. Report back on the effectiveness of Nurtec for future prescriptions.                      Contains text generated by Abridge.                                 Contains text generated by Abridge.                                  Contains text generated by Abridge.

## 2024-05-18 NOTE — Progress Notes (Signed)
 Subjective:  Patient ID: Rachel Carney, female    DOB: 02-20-1996  Age: 28 y.o. MRN: [id number]  Chief Complaint  Patient presents with   Medication Refill    Discussed the use of AI scribe software for clinical note transcription with the patient, who gave verbal consent to proceed.  History of Present Illness   Rachel Carney is a 28 year old female who presents for a refill of Cymbalta  and management of stress-related migraines.  She has had a particularly stressful week, working over forty hours, which has heightened her stress levels. She has been off Cymbalta  for two days. She is currently on her menstrual period, which makes her feel 'extra emotionally drained.'  She experiences migraines triggered by stress, describing a recent episode with a 'huge migraine,' nausea, and feeling miserable. She typically uses Excedrin Migraine for relief, but only had Tylenol available during her last episode, which provided some relief. No dizziness since stopping Cymbalta .  Her current medication regimen includes Cymbalta  60 mg, which [facility] [place]. She confirms the need for a 90-day supply of this medication.          05/18/2024   11:53 AM 11/11/2023    7:55 AM 11/11/2023    7:54 AM 08/11/2023    8:45 AM  Depression screen PHQ 2/9  Decreased Interest 2 1 1 1   Down, Depressed, Hopeless 2 2 1 1   PHQ - 2 Score 4 3 2 2   Altered sleeping 3 1 2 2   Tired, decreased energy 2 1 1 1   Change in appetite 2 1 2 1   Feeling bad or failure about yourself  2 1 0 0  Trouble concentrating 2 1 1  0  Moving slowly or fidgety/restless 1 1 0 0  Suicidal thoughts 0 1 0 0  PHQ-9 Score 16 10 8 6   Difficult doing work/chores Very difficult Somewhat difficult Somewhat difficult Somewhat difficult        05/18/2024   11:53 AM  Fall Risk   Falls in the past year? 0  Number falls in past yr: 0  Injury with Fall? 0  Risk for fall due to : No Fall Risks  Follow up Falls evaluation  completed    Patient Care Team: Teressa Harrie HERO, FNP as PCP - General (Family Medicine)   Review of Systems  Constitutional:  Negative for chills, diaphoresis, fatigue and fever.  HENT:  Negative for congestion, ear pain and sinus pain.   Eyes: Negative.   Respiratory:  Negative for cough and shortness of breath.   Cardiovascular:  Negative for chest pain.  Gastrointestinal:  Negative for abdominal pain, constipation, diarrhea, nausea and vomiting.  Endocrine: Negative.   Genitourinary:  Negative for dysuria, frequency and urgency.  Musculoskeletal:  Negative for arthralgias.  Allergic/Immunologic: Negative.   Neurological:  Negative for dizziness, weakness, light-headedness and headaches.  Hematological: Negative.   Psychiatric/Behavioral:  Negative for dysphoric mood. The patient is not nervous/anxious.     Current Outpatient Medications on File Prior to Visit  Medication Sig Dispense Refill   cyclobenzaprine  (FLEXERIL ) 5 MG tablet Take 1 tablet (5 mg total) by mouth 3 (three) times daily as needed for muscle spasms. 30 tablet 1   LORazepam  (ATIVAN ) 1 MG tablet Take 0.5 tablets (0.5 mg total) by mouth 3 (three) times daily as needed for anxiety. 12 tablet 0   norethindrone-ethinyl estradiol (LOESTRIN) 1-20 MG-MCG tablet Take 1 tablet by mouth daily.     Current Facility-Administered Medications on File  Prior to Visit  Medication Dose Route Frequency Provider Last Rate Last Admin   triamcinolone  acetonide (KENALOG -40) injection 40 mg  40 mg Intramuscular Once Thekkekandam, Thomas J, MD       Past Medical History:  Diagnosis Date   History of viral pericarditis 10/2016   Treated with short course steroids   Pleuritis    History reviewed. No pertinent surgical history.  Family History  Problem Relation Age of Onset   Anxiety disorder Father    Lymphoma Maternal Grandmother    Hypertension Maternal Grandfather    Atrial fibrillation Maternal Grandfather    Dementia Paternal  Grandmother    Heart attack Paternal Grandfather 43   Social History   Socioeconomic History   Marital status: Married    Spouse name: Rosalva Lush   Number of children: Not on file   Years of education: Not on file   Highest education level: Not on file  Occupational History   Occupation: Acupuncturist  Tobacco Use   Smoking status: Never   Smokeless tobacco: Never  Vaping Use   Vaping status: Never Used  Substance and Sexual Activity   Alcohol use: Yes   Drug use: Never   Sexual activity: Yes    Partners: Male    Birth control/protection: Pill  Other Topics Concern   Not on file  Social History Narrative   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 She lives at home with her mother father Raylene and sister.   She never smoked and does not use at all.   She usually works out runs and plays sports routinely 7 days a week 34 hours a day.   Social Drivers of Corporate investment banker Strain: Low Risk  (08/11/2023)   Overall Financial Resource Strain (CARDIA)    Difficulty of Paying Living Expenses: Not hard at all  Food Insecurity: No Food Insecurity (08/11/2023)   Hunger Vital Sign    Worried About Running Out of Food in the Last Year: Never true    Ran Out of Food in the Last Year: Never true  Transportation Needs: No Transportation Needs (08/11/2023)   PRAPARE - Administrator, Civil Service (Medical): No    Lack of Transportation (Non-Medical): No  Physical Activity: Sufficiently Active (08/11/2023)   Exercise Vital Sign    Days of Exercise per Week: 5 days    Minutes of Exercise per Session: 60 min  Stress: No Stress Concern Present (08/11/2023)   [employer] of Occupational Health - Occupational Stress Questionnaire    Feeling of Stress : Not at all  Social Connections: Moderately Integrated (08/11/2023)   Social Connection and Isolation Panel    Frequency of Communication with Friends and Family: More than  three times a week    Frequency of Social Gatherings with Friends and Family: More than three times a week    Attends Religious Services: More than 4 times per year    Active Member of [employer] or Organizations: No    Attends Engineer, structural: Never    Marital Status: Married    Objective:  BP 138/76   Pulse 77   Temp 98.2 F (36.8 C) (Temporal)   Resp 16   Ht 5' 9 (1.753 m)   Wt 158 lb 12.8 oz (72 kg)   LMP 05/13/2024 (Exact Date)   SpO2 98%   BMI 23.45 kg/m      05/18/2024   11:37 AM 01/17/2024  3:31 PM 01/12/2024    3:07 PM  BP/Weight  Systolic BP 138 118 128  Diastolic BP 76 76 62  Wt. (Lbs) 158.8 151 151.6  BMI 23.45 kg/m2 22.3 kg/m2 22.39 kg/m2    Physical Exam Vitals reviewed.  Constitutional:      General: She is not in acute distress.    Appearance: Normal appearance.  Eyes:     Conjunctiva/sclera: Conjunctivae normal.  Cardiovascular:     Rate and Rhythm: Normal rate and regular rhythm.     Heart sounds: Normal heart sounds. No murmur heard. Pulmonary:     Effort: Pulmonary effort is normal.     Breath sounds: Normal breath sounds. No wheezing.  Musculoskeletal:        General: Normal range of motion.  Skin:    General: Skin is warm.  Neurological:     Mental Status: She is alert. Mental status is at baseline.  Psychiatric:        Mood and Affect: Mood is anxious.        Speech: Speech normal.        Behavior: Behavior normal. Behavior is cooperative.        Thought Content: Thought content normal.     Lab Results  Component Value Date   WBC 7.3 05/03/2018   HGB 13.5 05/03/2018   HCT 39.9 05/03/2018   PLT 299 05/03/2018   GLUCOSE 101 (H) 05/03/2018   NA 143 05/03/2018   K 4.3 05/03/2018   CL 108 05/03/2018   CREATININE 0.91 05/03/2018   BUN 12 05/03/2018   CO2 27 05/03/2018      Assessment & Plan:  Anxiety and depression Assessment & Plan: Well controlled with medication management Currently out of medication for 2  days. Feels more symptoms due to menstruation.    05/18/2024   11:53 AM 11/11/2023    7:55 AM 11/11/2023    7:54 AM  PHQ9 SCORE ONLY  PHQ-9 Total Score 16 10 8        05/18/2024   11:54 AM 08/11/2023    8:45 AM  GAD 7 : Generalized Anxiety Score  Nervous, Anxious, on Edge 2 1  Control/stop worrying 2 1  Worry too much - different things 2 2  Trouble relaxing 2 2  Restless 2 1  Easily annoyed or irritable 3 2  Afraid - awful might happen 2 2  Total GAD 7 Score 15 11  Anxiety Difficulty Very difficult Somewhat difficult    - Refill sent for Cymbalta  60 mg  Orders: -     DULoxetine  HCl; Take 1 capsule (60 mg total) by mouth daily.  Dispense: 90 capsule; Refill: 1  Migraine without aura and without status migrainosus, not intractable Assessment & Plan: Migraine associated with stress Severe migraine with nausea likely stress-induced. Tylenol provided partial relief. - Provide sample of Nurtec for acute relief. - Advise use of Nurtec as needed, report effectiveness for future prescription.  Orders: -     Nurtec; Take 1 tablet (75 mg total) by mouth daily as needed.     Meds ordered this encounter  Medications   DULoxetine  (CYMBALTA ) 60 MG capsule    Sig: Take 1 capsule (60 mg total) by mouth daily.    Dispense:  90 capsule    Refill:  1   Rimegepant Sulfate (NURTEC) 75 MG TBDP    Sig: Take 1 tablet (75 mg total) by mouth daily as needed.       Follow-up: Return in about 2 months (  around 07/18/2024) for Annual Physical, lab visit.   I,Angela Taylor,acting as a Neurosurgeon for Harrie CHRISTELLA Cedar, FNP.,have documented all relevant documentation on the behalf of Harrie CHRISTELLA Cedar, FNP,as directed by  Harrie CHRISTELLA Cedar, FNP while in the presence of Harrie CHRISTELLA Cedar, FNP.   An After Visit Summary was printed and given to the patient.  I attest that I have reviewed this visit and agree with the plan scribed by my staff.   Harrie CHRISTELLA Cedar, FNP [facility] [phone]

## 2024-05-18 NOTE — Assessment & Plan Note (Signed)
 Migraine associated with stress Severe migraine with nausea likely stress-induced. Tylenol provided partial relief. - Provide sample of Nurtec for acute relief. - Advise use of Nurtec as needed, report effectiveness for future prescription.

## 2024-05-18 NOTE — Assessment & Plan Note (Signed)
 Well controlled with medication management Currently out of medication for 2 days. Feels more symptoms due to menstruation.    05/18/2024   11:53 AM 11/11/2023    7:55 AM 11/11/2023    7:54 AM  PHQ9 SCORE ONLY  PHQ-9 Total Score 16 10 8        05/18/2024   11:54 AM 08/11/2023    8:45 AM  GAD 7 : Generalized Anxiety Score  Nervous, Anxious, on Edge 2 1  Control/stop worrying 2 1  Worry too much - different things 2 2  Trouble relaxing 2 2  Restless 2 1  Easily annoyed or irritable 3 2  Afraid - awful might happen 2 2  Total GAD 7 Score 15 11  Anxiety Difficulty Very difficult Somewhat difficult    - Refill sent for Cymbalta  60 mg

## 2024-05-30 DIAGNOSIS — N946 Dysmenorrhea, unspecified: Secondary | ICD-10-CM | POA: Diagnosis not present

## 2024-05-30 DIAGNOSIS — N941 Unspecified dyspareunia: Secondary | ICD-10-CM | POA: Diagnosis not present

## 2024-06-12 ENCOUNTER — Encounter: Payer: Self-pay | Admitting: Sports Medicine

## 2024-07-27 DIAGNOSIS — N946 Dysmenorrhea, unspecified: Secondary | ICD-10-CM | POA: Diagnosis not present

## 2024-07-27 DIAGNOSIS — N941 Unspecified dyspareunia: Secondary | ICD-10-CM | POA: Diagnosis not present

## 2024-07-27 DIAGNOSIS — M7918 Myalgia, other site: Secondary | ICD-10-CM | POA: Diagnosis not present

## 2024-08-07 DIAGNOSIS — R29898 Other symptoms and signs involving the musculoskeletal system: Secondary | ICD-10-CM | POA: Diagnosis not present

## 2024-08-07 DIAGNOSIS — R102 Pelvic and perineal pain unspecified side: Secondary | ICD-10-CM | POA: Diagnosis not present

## 2024-08-07 DIAGNOSIS — M7918 Myalgia, other site: Secondary | ICD-10-CM | POA: Diagnosis not present

## 2024-08-07 DIAGNOSIS — K5641 Fecal impaction: Secondary | ICD-10-CM | POA: Diagnosis not present

## 2024-08-07 DIAGNOSIS — N941 Unspecified dyspareunia: Secondary | ICD-10-CM | POA: Diagnosis not present

## 2024-08-07 DIAGNOSIS — M5385 Other specified dorsopathies, thoracolumbar region: Secondary | ICD-10-CM | POA: Diagnosis not present

## 2024-08-20 DIAGNOSIS — R29898 Other symptoms and signs involving the musculoskeletal system: Secondary | ICD-10-CM | POA: Diagnosis not present

## 2024-08-20 DIAGNOSIS — K5641 Fecal impaction: Secondary | ICD-10-CM | POA: Diagnosis not present

## 2024-08-20 DIAGNOSIS — N941 Unspecified dyspareunia: Secondary | ICD-10-CM | POA: Diagnosis not present

## 2024-08-20 DIAGNOSIS — M7918 Myalgia, other site: Secondary | ICD-10-CM | POA: Diagnosis not present

## 2024-08-20 DIAGNOSIS — M5385 Other specified dorsopathies, thoracolumbar region: Secondary | ICD-10-CM | POA: Diagnosis not present

## 2024-08-20 DIAGNOSIS — R102 Pelvic and perineal pain unspecified side: Secondary | ICD-10-CM | POA: Diagnosis not present

## 2024-08-27 DIAGNOSIS — N941 Unspecified dyspareunia: Secondary | ICD-10-CM | POA: Diagnosis not present

## 2024-08-27 DIAGNOSIS — R29898 Other symptoms and signs involving the musculoskeletal system: Secondary | ICD-10-CM | POA: Diagnosis not present

## 2024-08-27 DIAGNOSIS — K5641 Fecal impaction: Secondary | ICD-10-CM | POA: Diagnosis not present

## 2024-08-27 DIAGNOSIS — R102 Pelvic and perineal pain unspecified side: Secondary | ICD-10-CM | POA: Diagnosis not present

## 2024-08-27 DIAGNOSIS — M7918 Myalgia, other site: Secondary | ICD-10-CM | POA: Diagnosis not present

## 2024-08-27 DIAGNOSIS — M5385 Other specified dorsopathies, thoracolumbar region: Secondary | ICD-10-CM | POA: Diagnosis not present

## 2024-10-18 ENCOUNTER — Emergency Department (HOSPITAL_COMMUNITY)

## 2024-10-18 ENCOUNTER — Encounter (HOSPITAL_COMMUNITY): Payer: Self-pay

## 2024-10-18 ENCOUNTER — Emergency Department (HOSPITAL_COMMUNITY)
Admission: EM | Admit: 2024-10-18 | Discharge: 2024-10-18 | Disposition: A | Discharge status: Home / Self Care | Source: Ambulatory Visit | Attending: Emergency Medicine | Admitting: Emergency Medicine

## 2024-10-18 ENCOUNTER — Other Ambulatory Visit: Payer: Self-pay

## 2024-10-18 DIAGNOSIS — K529 Noninfective gastroenteritis and colitis, unspecified: Secondary | ICD-10-CM | POA: Insufficient documentation

## 2024-10-18 DIAGNOSIS — R109 Unspecified abdominal pain: Secondary | ICD-10-CM | POA: Diagnosis present

## 2024-10-18 LAB — CBC WITH DIFFERENTIAL/PLATELET
Abs Immature Granulocytes: 0.05 K/uL (ref 0.00–0.07)
Basophils Absolute: 0 K/uL (ref 0.0–0.1)
Basophils Relative: 0 %
Eosinophils Absolute: 0 K/uL (ref 0.0–0.5)
Eosinophils Relative: 0 %
HCT: 45.2 % (ref 36.0–46.0)
Hemoglobin: 15 g/dL (ref 12.0–15.0)
Immature Granulocytes: 1 %
Lymphocytes Relative: 10 %
Lymphs Abs: 1.1 K/uL (ref 0.7–4.0)
MCH: 29.9 pg (ref 26.0–34.0)
MCHC: 33.2 g/dL (ref 30.0–36.0)
MCV: 90 fL (ref 80.0–100.0)
Monocytes Absolute: 0.5 K/uL (ref 0.1–1.0)
Monocytes Relative: 5 %
Neutro Abs: 8.6 K/uL — ABNORMAL HIGH (ref 1.7–7.7)
Neutrophils Relative %: 84 %
Platelets: 402 K/uL — ABNORMAL HIGH (ref 150–400)
RBC: 5.02 MIL/uL (ref 3.87–5.11)
RDW: 11.9 % (ref 11.5–15.5)
Smear Review: NORMAL
WBC: 10.2 K/uL (ref 4.0–10.5)
nRBC: 0 % (ref 0.0–0.2)

## 2024-10-18 LAB — COMPREHENSIVE METABOLIC PANEL WITH GFR
ALT: 13 U/L (ref 0–44)
AST: 25 U/L (ref 15–41)
Albumin: 4.2 g/dL (ref 3.5–5.0)
Alkaline Phosphatase: 54 U/L (ref 38–126)
Anion gap: 12 (ref 5–15)
BUN: 11 mg/dL (ref 6–20)
CO2: 20 mmol/L — ABNORMAL LOW (ref 22–32)
Calcium: 9.5 mg/dL (ref 8.9–10.3)
Chloride: 105 mmol/L (ref 98–111)
Creatinine, Ser: 0.82 mg/dL (ref 0.44–1.00)
GFR, Estimated: >60 mL/min
Glucose, Bld: 106 mg/dL — ABNORMAL HIGH (ref 70–99)
Potassium: 4.4 mmol/L (ref 3.5–5.1)
Sodium: 138 mmol/L (ref 135–145)
Total Bilirubin: 0.3 mg/dL (ref 0.0–1.2)
Total Protein: 7.7 g/dL (ref 6.5–8.1)

## 2024-10-18 LAB — HCG, SERUM, QUALITATIVE: Preg, Serum: NEGATIVE

## 2024-10-18 LAB — LIPASE, BLOOD: Lipase: 37 U/L (ref 11–51)

## 2024-10-18 MED ORDER — METHYLPREDNISOLONE SODIUM SUCC 40 MG IJ SOLR
40.0000 mg | Freq: Once | INTRAMUSCULAR | Status: AC
  Administered 2024-10-18: 40 mg via INTRAVENOUS
  Filled 2024-10-18: qty 1

## 2024-10-18 MED ORDER — ONDANSETRON 4 MG PO TBDP: 4.0000 mg | ORAL_TABLET | Freq: Three times a day (TID) | ORAL | 0 refills | Status: AC | PRN

## 2024-10-18 MED ORDER — IOHEXOL 300 MG/ML  SOLN
100.0000 mL | Freq: Once | INTRAMUSCULAR | Status: AC | PRN
  Administered 2024-10-18: 100 mL via INTRAVENOUS

## 2024-10-18 MED ORDER — DIPHENHYDRAMINE HCL 50 MG/ML IJ SOLN
50.0000 mg | Freq: Once | INTRAMUSCULAR | Status: AC
  Administered 2024-10-18: 50 mg via INTRAVENOUS
  Filled 2024-10-18: qty 1

## 2024-10-18 MED ORDER — DIPHENHYDRAMINE HCL 25 MG PO CAPS
50.0000 mg | ORAL_CAPSULE | Freq: Once | ORAL | Status: AC
  Filled 2024-10-18: qty 2

## 2024-10-18 MED ORDER — ONDANSETRON 8 MG PO TBDP
8.0000 mg | ORAL_TABLET | Freq: Once | ORAL | Status: AC
  Administered 2024-10-18: 8 mg via ORAL
  Filled 2024-10-18: qty 1

## 2024-10-18 MED ORDER — DICYCLOMINE HCL 20 MG PO TABS: 20.0000 mg | ORAL_TABLET | Freq: Two times a day (BID) | ORAL | 0 refills | Status: AC

## 2024-10-18 MED ORDER — OXYCODONE-ACETAMINOPHEN 5-325 MG PO TABS
1.0000 | ORAL_TABLET | Freq: Once | ORAL | Status: AC
  Administered 2024-10-18: 1 via ORAL
  Filled 2024-10-18: qty 1

## 2024-10-18 NOTE — Discharge Instructions (Addendum)
 Today you were seen for colitis.  Please pick up your Zofran  for nausea and vomiting and Bentyl  for abdominal pain.  Please maintain adequate oral hydration.  Please pick up your medication and take as prescribed.  Thank you for letting us  treat you today. After reviewing your labs and imaging, I feel you are safe to go home. Please follow up with your PCP in the next several days and provide them with your records from this visit. Return to the Emergency Room if pain becomes severe or symptoms worsen.

## 2024-10-18 NOTE — ED Provider Triage Note (Signed)
 Emergency Medicine Provider Triage Evaluation Note  Atisha Hamidi , a 29 y.o. female  was evaluated in triage.  Pt complains of abd pain, Nausea, fever, chills.  Review of Systems  Positive: Abd pain, nausea, fever, chlls Negative: Vomiting, D/cC, CP, SHOB  Physical Exam  BP (!) 149/93 (BP Location: Right Arm)   Pulse (!) 103   Temp 99.3 F (37.4 C) (Oral)   Resp 20   SpO2 100%  Gen:   Awake, uncomfy appearing Resp:  Normal effort  MSK:   Moves extremities without difficulty  Other:  Mildly tachycardic  Medical Decision Making  Medically screening exam initiated at 2:49 PM.  Appropriate orders placed.  Beatrix Breece was informed that the remainder of the evaluation will be completed by another provider, this initial triage assessment does not replace that evaluation, and the importance of remaining in the ED until their evaluation is complete.  Labs and imaging ordered   Francis Ileana LOISE DEVONNA 10/18/24 1450

## 2024-10-18 NOTE — ED Provider Notes (Signed)
 " [place] EMERGENCY DEPARTMENT AT [facility] Provider Note   CSN: [id number] Arrival date & time: 10/18/24  1426     Patient presents with: Abdominal Pain   Rachel Carney is a 29 y.o. female presents today from urgent care for evaluation of right sided abdominal pain and cramping with nausea, vomiting, fever, and chills.  Patient denies diarrhea, constipation, chest pain, shortness of breath, cough, congestion, any other complaints at this time.    Abdominal Pain Associated symptoms: chills, fever, nausea and vomiting        Prior to Admission medications  Medication Sig Start Date End Date Taking? Authorizing Provider  dicyclomine  (BENTYL ) 20 MG tablet Take 1 tablet (20 mg total) by mouth 2 (two) times daily. 10/18/24  Yes Caroleann Casler N, PA-C  ondansetron  (ZOFRAN -ODT) 4 MG disintegrating tablet Take 1 tablet (4 mg total) by mouth every 8 (eight) hours as needed for nausea or vomiting. 10/18/24  Yes Whalen Trompeter N, PA-C  cyclobenzaprine  (FLEXERIL ) 5 MG tablet Take 1 tablet (5 mg total) by mouth 3 (three) times daily as needed for muscle spasms. 11/11/23   Teressa Harrie HERO, FNP  DULoxetine  (CYMBALTA ) 60 MG capsule Take 1 capsule (60 mg total) by mouth daily. 05/18/24   Teressa Harrie HERO, FNP  LORazepam  (ATIVAN ) 1 MG tablet Take 0.5 tablets (0.5 mg total) by mouth 3 (three) times daily as needed for anxiety. 05/03/18   Jarold Olam HERO, PA-C  norethindrone-ethinyl estradiol (LOESTRIN) 1-20 MG-MCG tablet Take 1 tablet by mouth daily. 08/10/23 08/09/24  [provider]  Rimegepant Sulfate (NURTEC) 75 MG TBDP Take 1 tablet (75 mg total) by mouth daily as needed. 05/18/24   Teressa Harrie HERO, FNP    Allergies: Ivp dye [iodinated contrast media]    Review of Systems  Constitutional:  Positive for chills and fever.  Gastrointestinal:  Positive for abdominal pain, nausea and vomiting.    Updated Vital Signs BP 128/84   Pulse (!) 102   Temp 99.2 F (37.3 C) (Oral)   Resp 17    SpO2 99%   Physical Exam  (all labs ordered are listed, but only abnormal results are displayed) Labs Reviewed  CBC WITH DIFFERENTIAL/PLATELET - Abnormal; Notable for the following components:      Result Value   Platelets 402 (*)    Neutro Abs 8.6 (*)    All other components within normal limits  COMPREHENSIVE METABOLIC PANEL WITH GFR - Abnormal; Notable for the following components:   CO2 20 (*)    Glucose, Bld 106 (*)    All other components within normal limits  HCG, SERUM, QUALITATIVE  LIPASE, BLOOD    EKG: None  Radiology: CT ABDOMEN PELVIS W CONTRAST Result Date: 10/18/2024 EXAM: CT ABDOMEN AND PELVIS WITH CONTRAST 10/18/2024 08:13:26 PM TECHNIQUE: CT of the abdomen and pelvis was performed with the administration of 100 mL of iohexol  (OMNIPAQUE ) 300 MG/ML solution. Multiplanar reformatted images are provided for review. Automated exposure control, iterative reconstruction, and/or weight-based adjustment of the mA/kV was utilized to reduce the radiation dose to as low as reasonably achievable. COMPARISON: None available. CLINICAL HISTORY: Abdominal pain, acute, nonlocalized. FINDINGS: LOWER CHEST: No acute abnormality. LIVER: The liver is unremarkable. GALLBLADDER AND BILE DUCTS: Gallbladder is unremarkable. No biliary ductal dilatation. SPLEEN: No acute abnormality. PANCREAS: No acute abnormality. ADRENAL GLANDS: No acute abnormality. KIDNEYS, URETERS AND BLADDER: There is a subcentimeter hypodensity in the superior pole of the right kidney, likely a cyst. Per consensus, no follow-up is  needed for simple Bosniak type 1 and 2 renal cysts, unless the patient has a malignancy history or risk factors. No stones in the kidneys or ureters. No hydronephrosis. No perinephric or periureteral stranding. Urinary bladder is unremarkable. GI AND BOWEL: There is circumferential wall thickening of the ascending colon and hepatic flexure of the colon without surrounding inflammation. The appendix  appears within normal limits. There is a small hiatal hernia. Stomach demonstrates no acute abnormality. There is no bowel obstruction. PERITONEUM AND RETROPERITONEUM: There is a small amount of free fluid in the pelvis. No free air. VASCULATURE: Aorta is normal in caliber. LYMPH NODES: No lymphadenopathy. REPRODUCTIVE ORGANS: No acute abnormality. BONES AND SOFT TISSUES: No acute osseous abnormality. No focal soft tissue abnormality. IMPRESSION: 1. Circumferential wall thickening of the ascending colon and hepatic flexure without surrounding inflammation. Findings are concerning for nonspecific colitis. 2. Small amount of free fluid in the pelvis. 3. Small hiatal hernia. 4. Subcentimeter right renal hypodensity, likely a benign cyst, with no follow-up imaging recommended. Electronically signed by: Greig Pique MD MD 10/18/2024 08:27 PM EST RP Workstation: [id number]     Procedures   Medications Ordered in the ED  oxyCODONE -acetaminophen  (PERCOCET/ROXICET) 5-325 MG per tablet 1 tablet (1 tablet Oral Given 10/18/24 1532)  ondansetron  (ZOFRAN -ODT) disintegrating tablet 8 mg (8 mg Oral Given 10/18/24 1532)  methylPREDNISolone  sodium succinate (SOLU-MEDROL ) 40 mg/mL injection 40 mg (40 mg Intravenous Given 10/18/24 1540)  diphenhydrAMINE  (BENADRYL ) capsule 50 mg ( Oral See Alternative 10/18/24 1721)    Or  diphenhydrAMINE  (BENADRYL ) injection 50 mg (50 mg Intravenous Given 10/18/24 1721)  iohexol  (OMNIPAQUE ) 300 MG/ML solution 100 mL (100 mLs Intravenous Contrast Given 10/18/24 1953)                                    Medical Decision Making Amount and/or Complexity of Data Reviewed Labs: ordered. Radiology: ordered.  Risk Prescription drug management.   This patient presents to the ED for concern of abdominal pain with nausea and vomiting differential diagnosis includes viral GI illness, appendicitis, choledocholithiasis, acute cholecystitis, pancreatitis, SBO, diverticulitis, kidney stone  Lab  Tests:  I Ordered, and personally interpreted labs.  The pertinent results include: Mildly elevated platelets at 402, negative serum pregnancy, mildly reduced CO2 at 20, lipase 37   Imaging Studies ordered:  I ordered imaging studies including CT abdomen pelvis with contrast I independently visualized and interpreted imaging which showed circumferential wall thickening of the ascending colon and hepatic flexure without surrounding inflammation.  Findings concerning for nonspecific colitis.  Small amount of free fluid in the pelvis.  Subcentimeter right Ceanel hypodensity, likely a benign cyst, no follow-up imaging recommended. I agree with the radiologist interpretation   Medicines ordered and prescription drug management:  I ordered medication including Zofran , Percocet, Solu-Medrol , Benadryl     I have reviewed the patients home medicines and have made adjustments as needed   Problem List / ED Course:  Patient tolerating p.o. intake without issue prior to discharge Considered for admission further workup however patient's vital signs, physical exam, labs, and imaging are reassuring.  Patient symptoms likely due to colitis.  Patient given symptomatic management with dental and Zofran .  Patient given return precautions.  I feel patient safe for discharge at this time.      Final diagnoses:  Colitis    ED Discharge Orders          Ordered  dicyclomine  (BENTYL ) 20 MG tablet  2 times daily        10/18/24 2044    ondansetron  (ZOFRAN -ODT) 4 MG disintegrating tablet  Every 8 hours PRN        10/18/24 2044               Francis Ileana SAILOR, PA-C 10/18/24 2046    Tegeler, Lonni PARAS, MD 10/18/24 2346  "

## 2024-10-18 NOTE — ED Triage Notes (Signed)
 Pt has c/o nausea, right side abdominal pain/cramping worsening since yesterday

## 2024-10-27 ENCOUNTER — Other Ambulatory Visit: Payer: Self-pay | Admitting: Family Medicine

## 2024-10-27 DIAGNOSIS — F32A Depression, unspecified: Secondary | ICD-10-CM
